# Patient Record
Sex: Female | Born: 1970 | Race: White | Hispanic: No | Marital: Married | Smoking: Never smoker
Health system: Southern US, Community
[De-identification: ages and names within clinical notes are randomized; demographics above are authoritative.]

## PROBLEM LIST (undated history)

## (undated) DIAGNOSIS — J309 Allergic rhinitis, unspecified: Secondary | ICD-10-CM

## (undated) DIAGNOSIS — E785 Hyperlipidemia, unspecified: Secondary | ICD-10-CM

## (undated) DIAGNOSIS — H1010 Acute atopic conjunctivitis, unspecified eye: Secondary | ICD-10-CM

## (undated) DIAGNOSIS — Z87442 Personal history of urinary calculi: Secondary | ICD-10-CM

## (undated) DIAGNOSIS — J302 Other seasonal allergic rhinitis: Secondary | ICD-10-CM

## (undated) DIAGNOSIS — E559 Vitamin D deficiency, unspecified: Secondary | ICD-10-CM

## (undated) DIAGNOSIS — K219 Gastro-esophageal reflux disease without esophagitis: Secondary | ICD-10-CM

## (undated) DIAGNOSIS — G4700 Insomnia, unspecified: Secondary | ICD-10-CM

## (undated) DIAGNOSIS — R748 Abnormal levels of other serum enzymes: Secondary | ICD-10-CM

## (undated) DIAGNOSIS — T4145XA Adverse effect of unspecified anesthetic, initial encounter: Secondary | ICD-10-CM

## (undated) DIAGNOSIS — F419 Anxiety disorder, unspecified: Secondary | ICD-10-CM

## (undated) DIAGNOSIS — R51 Headache: Secondary | ICD-10-CM

## (undated) DIAGNOSIS — M545 Low back pain, unspecified: Secondary | ICD-10-CM

## (undated) DIAGNOSIS — D649 Anemia, unspecified: Secondary | ICD-10-CM

## (undated) DIAGNOSIS — R519 Headache, unspecified: Secondary | ICD-10-CM

## (undated) DIAGNOSIS — R112 Nausea with vomiting, unspecified: Secondary | ICD-10-CM

## (undated) DIAGNOSIS — W3400XA Accidental discharge from unspecified firearms or gun, initial encounter: Secondary | ICD-10-CM

## (undated) DIAGNOSIS — T8859XA Other complications of anesthesia, initial encounter: Secondary | ICD-10-CM

## (undated) DIAGNOSIS — Z9889 Other specified postprocedural states: Secondary | ICD-10-CM

## (undated) HISTORY — DX: Hyperlipidemia, unspecified: E78.5

## (undated) HISTORY — DX: Low back pain, unspecified: M54.50

## (undated) HISTORY — PX: KNEE ARTHROSCOPY: SHX127

## (undated) HISTORY — DX: Low back pain: M54.5

## (undated) HISTORY — DX: Allergic rhinitis, unspecified: J30.9

## (undated) HISTORY — DX: Headache, unspecified: R51.9

## (undated) HISTORY — PX: OTHER SURGICAL HISTORY: SHX169

## (undated) HISTORY — DX: Headache: R51

## (undated) HISTORY — DX: Insomnia, unspecified: G47.00

## (undated) HISTORY — DX: Allergic rhinitis, unspecified: H10.10

## (undated) HISTORY — DX: Abnormal levels of other serum enzymes: R74.8

## (undated) HISTORY — DX: Other seasonal allergic rhinitis: J30.2

## (undated) HISTORY — PX: CHOLECYSTECTOMY: SHX55

## (undated) HISTORY — DX: Vitamin D deficiency, unspecified: E55.9

## (undated) HISTORY — DX: Anemia, unspecified: D64.9

## (undated) HISTORY — DX: Gastro-esophageal reflux disease without esophagitis: K21.9

---

## 1998-11-10 ENCOUNTER — Encounter: Payer: Self-pay | Admitting: Gynecology

## 1998-11-10 ENCOUNTER — Ambulatory Visit (HOSPITAL_COMMUNITY): Admission: RE | Admit: 1998-11-10 | Discharge: 1998-11-10 | Payer: Self-pay | Admitting: Gynecology

## 1999-08-31 ENCOUNTER — Encounter (INDEPENDENT_AMBULATORY_CARE_PROVIDER_SITE_OTHER): Payer: Self-pay

## 1999-08-31 ENCOUNTER — Observation Stay (HOSPITAL_COMMUNITY): Admission: RE | Admit: 1999-08-31 | Discharge: 1999-09-01 | Payer: Self-pay | Admitting: Gynecology

## 2000-01-16 ENCOUNTER — Other Ambulatory Visit: Admission: RE | Admit: 2000-01-16 | Discharge: 2000-01-16 | Payer: Self-pay | Admitting: Gynecology

## 2000-05-23 ENCOUNTER — Encounter: Payer: Self-pay | Admitting: Gynecology

## 2000-05-23 ENCOUNTER — Ambulatory Visit (HOSPITAL_COMMUNITY): Admission: RE | Admit: 2000-05-23 | Discharge: 2000-05-23 | Payer: Self-pay | Admitting: Gynecology

## 2001-05-15 ENCOUNTER — Encounter: Payer: Self-pay | Admitting: Gynecology

## 2001-05-15 ENCOUNTER — Ambulatory Visit (HOSPITAL_COMMUNITY): Admission: RE | Admit: 2001-05-15 | Discharge: 2001-05-15 | Payer: Self-pay | Admitting: Gynecology

## 2002-01-21 ENCOUNTER — Other Ambulatory Visit: Admission: RE | Admit: 2002-01-21 | Discharge: 2002-01-21 | Payer: Self-pay | Admitting: Gynecology

## 2002-08-24 ENCOUNTER — Encounter (INDEPENDENT_AMBULATORY_CARE_PROVIDER_SITE_OTHER): Payer: Self-pay | Admitting: Specialist

## 2002-08-24 ENCOUNTER — Ambulatory Visit (HOSPITAL_BASED_OUTPATIENT_CLINIC_OR_DEPARTMENT_OTHER): Admission: RE | Admit: 2002-08-24 | Discharge: 2002-08-24 | Payer: Self-pay | Admitting: Gynecology

## 2003-03-04 ENCOUNTER — Emergency Department (HOSPITAL_COMMUNITY): Admission: EM | Admit: 2003-03-04 | Discharge: 2003-03-04 | Payer: Self-pay | Admitting: Emergency Medicine

## 2003-08-11 ENCOUNTER — Ambulatory Visit (HOSPITAL_COMMUNITY): Admission: RE | Admit: 2003-08-11 | Discharge: 2003-08-11 | Payer: Self-pay | Admitting: Family Medicine

## 2003-10-08 ENCOUNTER — Encounter (INDEPENDENT_AMBULATORY_CARE_PROVIDER_SITE_OTHER): Payer: Self-pay | Admitting: *Deleted

## 2003-10-08 ENCOUNTER — Inpatient Hospital Stay (HOSPITAL_COMMUNITY): Admission: AD | Admit: 2003-10-08 | Discharge: 2003-10-08 | Payer: Self-pay | Admitting: Gynecology

## 2004-03-13 ENCOUNTER — Other Ambulatory Visit: Admission: RE | Admit: 2004-03-13 | Discharge: 2004-03-13 | Payer: Self-pay | Admitting: Gynecology

## 2006-01-07 ENCOUNTER — Inpatient Hospital Stay (HOSPITAL_COMMUNITY): Admission: AD | Admit: 2006-01-07 | Discharge: 2006-01-07 | Payer: Self-pay | Admitting: Gynecology

## 2006-01-14 ENCOUNTER — Inpatient Hospital Stay (HOSPITAL_COMMUNITY): Admission: AD | Admit: 2006-01-14 | Discharge: 2006-01-14 | Payer: Self-pay | Admitting: Gynecology

## 2006-02-28 ENCOUNTER — Ambulatory Visit (HOSPITAL_COMMUNITY): Admission: RE | Admit: 2006-02-28 | Discharge: 2006-02-28 | Payer: Self-pay | Admitting: Specialist

## 2006-08-11 ENCOUNTER — Ambulatory Visit (HOSPITAL_COMMUNITY): Admission: RE | Admit: 2006-08-11 | Discharge: 2006-08-12 | Payer: Self-pay | Admitting: Neurosurgery

## 2007-01-12 ENCOUNTER — Ambulatory Visit (HOSPITAL_COMMUNITY): Admission: RE | Admit: 2007-01-12 | Discharge: 2007-01-12 | Payer: Self-pay | Admitting: Neurosurgery

## 2007-04-03 ENCOUNTER — Observation Stay (HOSPITAL_COMMUNITY): Admission: RE | Admit: 2007-04-03 | Discharge: 2007-04-03 | Payer: Self-pay | Admitting: Neurosurgery

## 2007-08-06 ENCOUNTER — Inpatient Hospital Stay (HOSPITAL_COMMUNITY): Admission: EM | Admit: 2007-08-06 | Discharge: 2007-08-11 | Payer: Self-pay | Admitting: Emergency Medicine

## 2007-08-06 ENCOUNTER — Encounter: Payer: Self-pay | Admitting: Emergency Medicine

## 2007-11-05 ENCOUNTER — Other Ambulatory Visit: Admission: RE | Admit: 2007-11-05 | Discharge: 2007-11-05 | Payer: Self-pay | Admitting: Gynecology

## 2008-02-22 ENCOUNTER — Ambulatory Visit (HOSPITAL_BASED_OUTPATIENT_CLINIC_OR_DEPARTMENT_OTHER): Admission: RE | Admit: 2008-02-22 | Discharge: 2008-02-22 | Payer: Self-pay | Admitting: Family Medicine

## 2008-02-28 ENCOUNTER — Ambulatory Visit: Payer: Self-pay | Admitting: Internal Medicine

## 2008-05-24 ENCOUNTER — Inpatient Hospital Stay (HOSPITAL_COMMUNITY): Admission: AD | Admit: 2008-05-24 | Discharge: 2008-05-24 | Payer: Self-pay | Admitting: Obstetrics and Gynecology

## 2008-06-15 ENCOUNTER — Ambulatory Visit (HOSPITAL_COMMUNITY): Admission: RE | Admit: 2008-06-15 | Discharge: 2008-06-15 | Payer: Self-pay | Admitting: Obstetrics and Gynecology

## 2008-08-26 ENCOUNTER — Inpatient Hospital Stay (HOSPITAL_COMMUNITY): Admission: AD | Admit: 2008-08-26 | Discharge: 2008-08-26 | Payer: Self-pay | Admitting: Obstetrics and Gynecology

## 2008-09-04 ENCOUNTER — Inpatient Hospital Stay (HOSPITAL_COMMUNITY): Admission: AD | Admit: 2008-09-04 | Discharge: 2008-09-04 | Payer: Self-pay | Admitting: Obstetrics and Gynecology

## 2008-09-14 ENCOUNTER — Inpatient Hospital Stay (HOSPITAL_COMMUNITY): Admission: AD | Admit: 2008-09-14 | Discharge: 2008-09-14 | Payer: Self-pay | Admitting: Obstetrics & Gynecology

## 2008-11-10 ENCOUNTER — Inpatient Hospital Stay (HOSPITAL_COMMUNITY): Admission: AD | Admit: 2008-11-10 | Discharge: 2008-11-10 | Payer: Self-pay | Admitting: Obstetrics and Gynecology

## 2008-11-12 ENCOUNTER — Inpatient Hospital Stay (HOSPITAL_COMMUNITY): Admission: AD | Admit: 2008-11-12 | Discharge: 2008-11-12 | Payer: Self-pay | Admitting: Obstetrics and Gynecology

## 2008-11-17 ENCOUNTER — Inpatient Hospital Stay (HOSPITAL_COMMUNITY): Admission: RE | Admit: 2008-11-17 | Discharge: 2008-11-19 | Payer: Self-pay | Admitting: Obstetrics and Gynecology

## 2009-11-30 ENCOUNTER — Inpatient Hospital Stay (HOSPITAL_COMMUNITY): Admission: RE | Admit: 2009-11-30 | Discharge: 2009-12-01 | Payer: Self-pay | Admitting: Obstetrics and Gynecology

## 2009-11-30 ENCOUNTER — Encounter (INDEPENDENT_AMBULATORY_CARE_PROVIDER_SITE_OTHER): Payer: Self-pay | Admitting: Obstetrics and Gynecology

## 2010-02-18 HISTORY — PX: ABDOMINAL HYSTERECTOMY: SUR658

## 2010-02-18 HISTORY — PX: OOPHORECTOMY: SHX86

## 2010-05-02 LAB — CBC
Hemoglobin: 12.1 g/dL (ref 12.0–15.0)
Hemoglobin: 9.1 g/dL — ABNORMAL LOW (ref 12.0–15.0)
MCH: 26.1 pg (ref 26.0–34.0)
MCHC: 33.2 g/dL (ref 30.0–36.0)
MCV: 78.8 fL (ref 78.0–100.0)
MCV: 78.8 fL (ref 78.0–100.0)
Platelets: 293 10*3/uL (ref 150–400)
Platelets: 298 10*3/uL (ref 150–400)
RBC: 3.5 MIL/uL — ABNORMAL LOW (ref 3.87–5.11)
RDW: 15.4 % (ref 11.5–15.5)
RDW: 15.8 % — ABNORMAL HIGH (ref 11.5–15.5)
WBC: 12.1 10*3/uL — ABNORMAL HIGH (ref 4.0–10.5)

## 2010-05-02 LAB — PREGNANCY, URINE: Preg Test, Ur: NEGATIVE

## 2010-05-24 LAB — CBC
HCT: 23.6 % — ABNORMAL LOW (ref 36.0–46.0)
MCV: 81.1 fL (ref 78.0–100.0)
Platelets: 251 10*3/uL (ref 150–400)

## 2010-05-25 LAB — CBC
MCHC: 32.4 g/dL (ref 30.0–36.0)
Platelets: 317 10*3/uL (ref 150–400)
WBC: 10 10*3/uL (ref 4.0–10.5)

## 2010-05-25 LAB — TYPE AND SCREEN: ABO/RH(D): A POS

## 2010-05-25 LAB — RPR: RPR Ser Ql: NONREACTIVE

## 2010-05-27 LAB — URINALYSIS, ROUTINE W REFLEX MICROSCOPIC
Bilirubin Urine: NEGATIVE
Glucose, UA: NEGATIVE mg/dL
Nitrite: NEGATIVE

## 2010-05-30 LAB — CBC
Hemoglobin: 11.4 g/dL — ABNORMAL LOW (ref 12.0–15.0)
Hemoglobin: 11.9 g/dL — ABNORMAL LOW (ref 12.0–15.0)
MCHC: 34.1 g/dL (ref 30.0–36.0)
MCV: 83.1 fL (ref 78.0–100.0)
MCV: 85.1 fL (ref 78.0–100.0)
Platelets: 314 10*3/uL (ref 150–400)
RBC: 3.98 MIL/uL (ref 3.87–5.11)
RDW: 13.8 % (ref 11.5–15.5)
RDW: 13.9 % (ref 11.5–15.5)
WBC: 10.1 10*3/uL (ref 4.0–10.5)

## 2010-07-03 NOTE — Op Note (Signed)
[name], [name]          ACCOUNT NO.:  [id number]   MEDICAL RECORD NO.:  [id number]          PATIENT TYPE:  AMB   LOCATION:  SDC                           FACILITY:  [facility]   PHYSICIAN:  Duke Salvia. Marcelle Overlie, M.D.DATE OF BIRTH:  [birth year]   DATE OF PROCEDURE:  06/15/2008  DATE OF DISCHARGE:                               OPERATIVE REPORT   PREOPERATIVE DIAGNOSES:  1. History of possible incompetent cervix.  2. 16-1/2-week intrauterine pregnancy.   POSTOPERATIVE DIAGNOSES:  1. History of possible incompetent cervix.  2. 16-1/2-week intrauterine pregnancy.   PROCEDURE:  McDonald cerclage.   SURGEON:  Duke Salvia. Marcelle Overlie, MD   ANESTHESIA:  Spinal.   COMPLICATIONS:  None.   DRAINS:  In-and-out Foley catheter.   BLOOD LOSS:  Minimal.   PROCEDURE AND FINDINGS:  The patient went to the operating room after an  adequate level of spinal anesthetic was obtained.  The patient's leg in  stirrups, the perineum and vagina prepped in usual manner for vaginal  procedures.  The bladder was drained.  A weighted speculum was  positioned.  A #2 Tevdek suture was used to place a pursestring McDonald  cerclage starting at 11 o'clock at the cervicovaginal junction.  Ring  forceps used to gently place the cervix on traction as needed to place  circumferential pursestring suture tied down anteriorly.  There was  minimal bleeding.  She tolerated this well and went to recovery room in  good condition.      Richard M. Marcelle Overlie, M.D.  Electronically Signed     RMH/MEDQ  D:  06/15/2008  T:  06/16/2008  Job:  [id number]

## 2010-07-03 NOTE — Op Note (Signed)
[name], [name]          ACCOUNT NO.:  [id number]   MEDICAL RECORD NO.:  [id number]          PATIENT TYPE:  AMB   LOCATION:  SDS                          FACILITY:  [facility]   PHYSICIAN:  Donalee Citrin, M.D.        DATE OF BIRTH:  [birth year]   DATE OF PROCEDURE:  01/12/2007  DATE OF DISCHARGE:  01/12/2007                               OPERATIVE REPORT   PREOPERATIVE DIAGNOSIS:  Left carpal tunnel syndrome.   PROCEDURE:  Left carpal tunnel release.   SURGEON:  Donalee Citrin, M.D.   ANESTHESIA:  Bier block.   HISTORY OF PRESENT ILLNESS:  The patient is very pleasant 40 year old  female who has a longstanding numbness, tingling and pain in her wrist.  EMG showed moderately severe left carpal tunnel syndrome.  The patient  was refractory to all forms of conservative treatment, bracing and  injections and the patient presents for carpal tunnel release.  The  risks and benefits of the operation were explained to the patient and  she agrees to proceed forward.   The patient was brought into the OR and was induced under Bier block  anesthesia with a regional block of the left upper extremity.  Then  after adequate anesthesia had been confirmed, an incision was made out  from the distal crease of the wrist along the palmar crease towards the  second digit, excluding the thumb, approximately 3-4 cm long.  This was  incised with a 15 blade scalpel, a mastoid retractor was placed, and  then using a 15-blade scalpel with careful sharp dissection layer by  layer through the transverse carpal ligament and flexor retinaculum,  this ligament was divided until the epineurium of the nerve was then  visualized.  Then using a hemostat to develop the plane from the  undersurface of the ligament overlying the epineurium and hemostats the  holding it up and away, the ligament was then divided proximally and  distally until complete decompression of the median nerve was obtained.  After this was  observed and the hemostat easily passed both proximally  and distally, the wound copiously irrigated and the skin was closed with  interrupted vertical mattress, dressed with Adaptic, fluffs and Kerlix,  and the patient went to the recovery room in stable condition.  At the  end of the case all needle counts and sponge counts were correct.           ______________________________  Donalee Citrin, M.D.     GC/MEDQ  D:  01/12/2007  T:  01/13/2007  Job:  [id number]

## 2010-07-03 NOTE — Op Note (Signed)
[name], [name]          ACCOUNT NO.:  [id number]   MEDICAL RECORD NO.:  [id number]          PATIENT TYPE:  OIB   LOCATION:  3033                         FACILITY:  [facility]   PHYSICIAN:  Donalee Citrin, M.D.        DATE OF BIRTH:  [birth year]   DATE OF PROCEDURE:  08/11/2006  DATE OF DISCHARGE:  08/12/2006                               OPERATIVE REPORT   PREOPERATIVE DIAGNOSIS:  Cervical spondylosis with radiculopathy C6,  left.   PROCEDURE:  Anterior cervical diskectomy and fusion at C5-6 using a 6-mm  allograft wedge at C5-6, a 23-mm Venture plate with four 35 mm screws.   SURGEON:  Donalee Citrin, M.D.   ASSISTANTJordan Likes.   ANESTHESIA:  General endotracheal.   HISTORY OF PRESENT ILLNESS:  A 40 year old female who has had  longstanding neck and left arm pain radiating down her forearm and thumb  with numbness and tingling in the same distribution.  The patient  appeared to have  weakness of her biceps and little bit in her triceps.  __________  shows spondylosis with spondylolytic compression and disk at  the left C6 nerve root.  The patient failed of form of conservative  treatment.  Risks and benefits of the operation were explained to the  patient and she agreed to proceed forward.   DESCRIPTION OF PROCEDURE:  The patient was brought to the OR, was  induced under general anesthesia, placed supine with neck in extension  in five pounds of haltertraction and the right side of her neck was  prepped and draped in the usual sterile fashion.  __________  localized  the C5-6 disk space.  A curvilinear incision was made __________  sternocleidomastoid superficially, and the platysma was dissected out  and divided longitudinally.  The avascular plane between the  sternocleidomastoid and the strap muscles was developed down to the  prevertebral fascia.  The prevertebral fascia __________ .  Intraoperative x-ray confirmed localization of the appropriate level,  annulotomy with a 15  blade scalpel marked the disk space and then the  __________  retracted laterally and a self-retaining retractor was  placed.  Annulotomy was then extended, disk space was cleaned out,  drilled down the posterior annulus and posterior __________  complex  with a high speed drill.  Then using a 1-mm Kerrison punch the  undersurface of the C5 and C6 vertebral bodies were underbitten exposing  the thecal sac and this was carried out to the C6 neural foramen  __________  several __________  were removed from the foramen with a 2-  mm Kerrison punch and a black nerve hook decompressing the nerve root  flush with the C6 pedicle.  __________  there was some spondylosis with  a spur compressing the right side of the central canal and thecal sac.  This was all underbitten with a 2-mm Kerrison punch decompressing the  right C6 nerve root flush to the pedicles as well.  After both foramina  were widely patent and the central canal was decompressed and was  copiously irrigated a size 6-mm graft was inserted after preparation of  the endplates.  A 23-mm Venture plate was then inserted with four 32-mm  screws were drilled and placed, all screws had excellent purchase,  locking mechanism was engaged.  The meticulous hemostasis  was maintained.  The wound was copiously irrigated.  The wound was  closed in layers with interrupted Vicryl in the platysma and a running 4-  0 subcuticular.  Benzoin and Steri-Strips applied.  The patient went to  the recovery room in stable condition.           ______________________________  Donalee Citrin, M.D.     GC/MEDQ  D:  08/11/2006  T:  08/12/2006  Job:  [id number]
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
## 2010-07-03 NOTE — Op Note (Signed)
[name], [name]          ACCOUNT NO.:  [id number]   MEDICAL RECORD NO.:  [id number]          PATIENT TYPE:  OBV   LOCATION:  3526                         FACILITY:  [facility]   PHYSICIAN:  Donalee Citrin, M.D.        DATE OF BIRTH:  [birth year]   DATE OF PROCEDURE:  04/03/2007  DATE OF DISCHARGE:  04/03/2007                               OPERATIVE REPORT   PREOPERATIVE DIAGNOSIS:  Right carpal tunnel syndrome.   PROCEDURE:  Right carpal tunnel release.   SURGEON:  Donalee Citrin, M.D.   ANESTHESIA:  Bier block.   HISTORY OF PRESENT ILLNESS:  The patient is a very pleasant 36-year  female who underwent left carpal tunnel release several months ago who  has progressively worsening right hand pain, EMG consistent with right  carpal tunnel syndrome.  The patient failed all forms of conservative  treatment with bracing.  The patient was recommended a right carpal  release, the risks and benefits of the operation were discussed with the  patient, and she agreed to proceed forward.   The patient was brought to the OR and was induced under Bier block  anesthesia with tourniquet time being proximally 30 minutes.  The right  hand was prepped and draped in the usual sterile fashion.  An incision  was drawn up from the distal crease at the wrist, along the palmar  crease toward the middle finger, extending approximately 4 cm long.  A  15 blade scalpel was used to make the incision after adequate anesthesia  and analgesia was confirmed.  The palmaris fascia was dissected to the  transverse carpal ligament and the flexor retinaculum.  This was then  divided sequentially in layers very carefully with a 15 blade scalpel  until the epineurium of the nerve was visualized.  Then using a  hemostat, mosquito, the undersurface of the transverse carpal ligament  was freed up from the underlying median nerve epineurium and this was  divided, decompressing the median nerve both proximally and distally.  Extensive undercutting of the transverse carpal ligament along the track  of the median nerve was carried out very carefully until easily the  sheath around the median nerve easily passed a hemostat both proximally  and distally up the medial aspect of the forearm as well as distal part  of the hand.  The wound was then copiously irrigated and meticulous  hemostasis was maintained and the skin was closed in layers with  interrupted vertical mattress.  Tourniquet time:  It was then released  again in proximally 30 minutes and the patient was moved to the recovery  room in stable condition.           ______________________________  Donalee Citrin, M.D.     GC/MEDQ  D:  04/03/2007  T:  04/04/2007  Job:  [id number]

## 2010-07-03 NOTE — Procedures (Signed)
NAME:  [name], [name]          ACCOUNT NO.:  [id number]   MEDICAL RECORD NO.:  [id number]          PATIENT TYPE:  OUT   LOCATION:  SLEEP CENTER                 [facility]ACILITY:  [facility]   PHYSICIAN:  Clinton D. Maple Hudson, MD, FCCP, FACPDATE OF BIRTH:  1970-04-22   DATE OF STUDY:  02/22/2008                            NOCTURNAL POLYSOMNOGRAM   REFERRING PHYSICIAN:  Renae Fickle   REFERRING PHYSICIAN:  Dr. Renae Fickle   INDICATION FOR STUDY:  Hypersomnia with sleep apnea.   EPWORTH SLEEPINESS SCORE:  Epworth sleepiness score 12/24.  BMI 27.8.  Weight 157 pounds.  Height 63 inches.  Neck 14 inches.   MEDICATIONS:  Home medications are charted and reviewed.   SLEEP ARCHITECTURE:  Total sleep time 339 minutes with sleep efficiency  86.8%.  Stage I was 3.4%.  Stage II 76%.  Stage III absent.  REM 20.6%  of total sleep time.  Sleep latency 13 minutes.  REM latency 105  minutes.  Awake after sleep onset 37 minutes.  Arousal index 16.8.  No  bedtime medication was taken.   RESPIRATORY DATA:  Apnea-hypopnea index (AHI) 3.4 per hour.  A total of  19 events were scored including 1 obstructive apnea, 2 central apneas,  and 16 hypopneas.  Events were mostly associated with supine sleep,  although she appeared to sleep mostly on her sides.  There were  insufficient events to quantify for CPAP titration by split protocol on  the study night.   OXYGEN DATA:  Moderately loud snoring with oxygen desaturation to a  nadir of 90%.  Mean oxygen saturation through the study was 95.3% on  room air.   CARDIAC DATA:  Normal sinus rhythm.   MOVEMENT-PARASOMNIA:  Periodic limb movement with arousal.  A total of  58 limb jerks were counted of which 19 were associated with arousal or  awakening for an arousal index of 3.4 per hour.  Bathroom x1.   IMPRESSIONS-RECOMMENDATIONS:  1. Occasional respiratory events with sleep disturbance, within      normal, apnea-hypopnea index 3.4 per hour (normal range 0-5 per  hour).  Events were associated with nonsupine sleep position,      moderately loud snoring and oxygen desaturation to a nadir of 90%.  2. Periodic limb jerk syndrome with arousal 3.4 per hour.  This may      reflect enough movement disturbance in the      home environment to interfere with sleep quality.  Consider a      therapeutic trial of ReQuip or Mirapex if clinically indicated.      Clinton D. Maple Hudson, MD, [facility], FACP  Diplomate, Biomedical engineer of Sleep Medicine  Electronically Signed     CDY/MEDQ  D:  02/27/2008 09:38:42  T:  02/27/2008 16:10:96  Job:  [id number]
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
## 2010-07-03 NOTE — H&P (Signed)
NAMEHAZELYN, [name]          ACCOUNT NO.:  [id number]   MEDICAL RECORD NO.:  [id number]          PATIENT TYPE:  AMB   LOCATION:  SDC                           FACILITY:  [facility]   PHYSICIAN:  Duke Salvia. Marcelle Overlie, M.D.DATE OF BIRTH:  [birth year]   DATE OF ADMISSION:  DATE OF DISCHARGE:                              HISTORY & PHYSICAL   DATE OF SCHEDULED SURGERY:  June 15, 2008.   CHIEF COMPLAINT:  Pregnancy, incompetent cervix.   HISTORY OF PRESENT ILLNESS:  A 40 year old G7, P2-0-4-2.  This is an IVF  pregnancy.  When she was being evaluated and treated in 2008, had an  open myomectomy and as part of her evaluation was told that she had an  incompetent cervix based on a history of SAB x4.  Two prior pregnancies  in 1989 and 1992 were delivered at 37 weeks vaginally.   She has had a prior tubal with reversal in 2001.  This pregnancy has  been complicated by a subchorionic hemorrhage with gradual resolution.   EXAM:  By ultrasound the day prior to surgery, revealed a cervical  length of 4.5 cm with a persistent subchorionic hemorrhage.  No evidence  of funneling.  She has declined amniocentesis with a first trimester  screen and AFP only, both of which have returned normal.  This  procedure, including risks related to bleeding, infection, are reviewed  with her.  Her history for incompetent cervix are somewhat unclear,  based more on recommendations for her treating physician in [place].  This  was reviewed with her.  She would prefer to proceed with McDonald  cerclage.   PAST MEDICAL HISTORY:   ALLERGIES:  None.   SURGICAL HISTORY:  Please see her [facility] form.   REVIEW OF SYSTEMS:  Please see her Hollister form.   PHYSICAL EXAM:  VITAL SIGNS:  Temperature 98.2, blood pressure 120/72.  HEENT:  Unremarkable.  NECK:  Supple without masses.  LUNGS:  Clear.  CARDIOVASCULAR:  Regular rate and rhythm without murmurs, rubs, or  gallops noted.  BREASTS:  Without masses.  ABDOMEN:  A 16-week-sized uterus.  Fetal heart rate 140.  GENITOURINARY:  Cervix was closed.  EXTREMITIES:  Unremarkable.  NEUROLOGIC:  Unremarkable.   IMPRESSION:  1. A 16-1/2 week intrauterine pregnancy.  2. History of prior myomectomy.  3. Advanced maternal age.  4. In vitro fertilization pregnancy.  5. History of possible incompetent cervix.   PLAN:  McDonald cerclage procedure and risks reviewed as above.      Richard M. Marcelle Overlie, M.D.  Electronically Signed     RMH/MEDQ  D:  06/14/2008  T:  06/14/2008  Job:  [id number]

## 2010-07-06 NOTE — H&P (Signed)
[facility]  Patient:    [name], [name]                    MRN: [id number] Adm. Date:  08/31/99 Attending:  Marcial Pacas P. Fontaine, M.D.                         History and Physical  CHIEF COMPLAINT:              Secondary infertility.  HISTORY OF PRESENT ILLNESS:   A 40 year old, gravida 2, para 2, female status post laparoscopic tubal sterilization who desires bilateral tubal reanastomosis. The patient has undergone laparoscopy which has revealed bilateral 5 cm normal appearing distal segments and proximal segments of 1.5 cm on the left and 0.5 cm on the right.  She does have a history of endometriosis, status post laser vaporization in the past.  She recently underwent HSG which revealed a normal endometrial cavity and bilateral tubal proximal lengths of 3 cm.  The patient is admitted at this time for bilateral tubal reanastomoses.  PAST MEDICAL HISTORY:         Uncomplicated.  PAST SURGICAL HISTORY:        Includes laparoscopic tubal sterilization and diagnostic laparoscopy x 2 in 1994 and in 1995.  ALLERGIES:                    No known drug allergies.  SOCIAL HISTORY:               Noncontributory without cigarette or alcohol use.   FAMILY HISTORY:               Noncontributory.  REVIEW OF SYSTEMS:            Noncontributory.  PHYSICAL EXAMINATION:  VITAL SIGNS:                  Afebrile, vital signs are stable.  HEENT:                        Normal.  LUNGS:                        Clear.  HEART:                        Regular rate and rhythm without murmurs, rubs, or  gallops.  ABDOMEN:                      Benign.  PELVIC:                       External BUS and vagina normal.  Cervix normal. Uterus normal size and nontender.  Adnexa without masses or tenderness.  ASSESSMENT:                   A 40 year old, gravida 2, para 2, female status post laparoscopic tubal sterilization who desires bilateral tubal reanastomoses  due o a new relationship.  The patient has undergone evaluation to include laparoscopy which showed normal distal segments of 5 cm and proximal segments of 1.5 cm on ne side and 0.5 cm on the other.  HSG shows 3 cm proximal segments bilaterally. The risks, benefits, indications, and alternatives for bilateral tubal reanastomosis were discussed with the patient and the expected intraoperative and postoperative courses were reviewed.  I discussed  with the patient the procedure on several occasions on an outpatient basis and offered referral to a reproductive endocrinologist not only to review the procedure, but to discuss in vitro fertilization.  The patient understands clearly the alternative for in vitro fertilization and the advantages, risks, benefits of in vitro fertilization versus tubal reanastomosis and she wants to proceed with tubal reanastomosis.  No guarantees as far as the success for the procedure were made.  The patient understands that at any time during the procedure we may abort the procedure due to unexpected findings intraoperatively and we may not be able to proceed with either side or that we may able to do one side and not the other and she understands this possibility and accepts this.  The patient also understands that if we are successful in reanastomosing her fallopian tubes no guarantees as far as pregnancy were made.  There are many factors that influence pregnancies and she understands due to her history of endometriosis that certainly this is an independent risk factor for infertility.  I have also asked in the past for her partner to provide a semenanalysis and she rejects this request.  He has fathered other children, but she also understands that she may go through the procedure and then we ultimately find out that he is infertile and she understands and accepts this.  She understands also that the quality of the fallopian tubes as well as the  final length will also influence her outcome.  The patient understands that if we are  successful, she is at an increased risk for an ectopic pregnancy and that she would report any pregnancy event immediately so that we can locate and determine the viability of her gestation.  The patient does have regular menses with minimal symptoms and has no problems achieving pregnancy with her first two gestations. I discussed my recommendations that after the initial healing process that her and her partner attempt pregnancy quickly to maximize her chances.  The general risks of abdominal surgery were reviewed with her.  The patient was counseled as to the risks for inadvertant injury to internal organs including bowel, bladder, ureters, vessels, and nerves requiring more extensive reparative surgeries and future reparative surgeries including ostomy formation was discussed with her.  The risks of hemorrhage leading to transfusion and the risks of transfusion including transfusion reaction, hepatitis, and HIV were all discussed, understood, and accepted.  The risks for wound complications including opening and draining of incisions, closure by secondary intention, as well as internal peritoneal infections requiring prolonged antibiotics was all discussed, understood, and accepted.  The patients questions were answered to her satisfaction and she is ready to proceed with surgery. DD:  08/30/99 TD:  08/30/99 Job: [id number]
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
## 2010-07-06 NOTE — Op Note (Signed)
[facility]  Patient:    [name], [name]                  MRN: [id number] Proc. Date: 08/31/99 Adm. Date:  [id number] Disc. Date: [id number] Attending:  Merrily Pew                           Operative Report  ADDENDUM TO OPERATIVE PROCEDURE NOTE:  Pathology report subsequently returns postoperative and showed left ovarian cyst fluid which showed findings consistent with the contents of a benign cyst.  Intraoperatively, the patient had a functional small left ovarian cyst that normally would not have been addressed because of its normal appearance, but during the reconstruction of the left fallopian tube to easily approximate the distal and proximal segments, the cyst fluid was aspirated to collapse the cyst to allow the segments to reapproximate without tension.  The intention was not to send this to pathology as it was totally benign in appearance, again, consistent with a functional cyst, and apparently, the scrub technician inadvertently sent this.  The functional cyst was approximately 1.5 cm in diameter and was collapsed with aspiration and was otherwise left undisturbed with no bleeding. DD:  09/07/99 TD:  09/09/99 Job: [id number]

## 2010-07-06 NOTE — H&P (Signed)
NAME:  [name], [name]                     ACCOUNT NO.:  [id number]   MEDICAL RECORD NO.:  [id number]                   PATIENT TYPE:  AMB   LOCATION:  [facility]                                 FACILITY:  [facility]   PHYSICIAN:  Timothy P. Fontaine, M.D.           DATE OF BIRTH:  25-Feb-1970   DATE OF ADMISSION:  DATE OF DISCHARGE:                                HISTORY & PHYSICAL   CHIEF COMPLAINT:  1. Endometriosis.  2. Infertility.   HISTORY OF PRESENT ILLNESS:  [age]-year-old G2, P2 female, history of  endometriosis in the past, status post laparoscopic diagnoses and laser  vaporization with secondary infertility.  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  She has  a normal appearing right fallopian tube with fill and spill and a normal  appearing left fallopian tube with localized spill at the tip consistent  with peritubular adhesions.  She underwent consultation with [facility] for Reproductive Medicine where they discussed the options to include  in vitro fertilization as well as laparoscopic evaluation given her history  of endometriosis and she elects for laparoscopic surgery.   PAST MEDICAL HISTORY:  Uncomplicated.   PAST SURGICAL HISTORY:  Laparoscopic tubal sterilization.  Laparoscopic  laser vaporization and endometriosis.  Bilateral tubal re-anastomoses.   ALLERGIES:  No medications.   CURRENT MEDICATIONS:  Protonix for reflux.   REVIEW OF SYSTEMS:  Noncontributory.   SOCIAL HISTORY:  Noncontributory.   FAMILY HISTORY:  Noncontributory.   ADMISSION PHYSICAL EXAM:  Afebrile.  Vital signs are stable.  HEENT:  Normal.  LUNGS:  Clear.  CARDIAC:  Regular rate without rubs, murmurs, or gallops.  ABDOMEN:  Benign.  PELVIC:  External BUS.  Vagina normal.  Cervix normal.  Uterus normal size,  nontender.  Adnexa  without masses or tenderness.   ASSESSMENT:  1. [age]-year-old G2, P2 female with secondary infertility, status     post tubal sterilization and tubal reversal, gonadotropin superovatory     cycle without success.  2. History of laparoscopic proven endometriosis for laparoscopy, possible     lysis of adhesion, possible ablation of endometriosis.  The risks,     benefits, indications, and alternatives for the procedure were reviewed     with the patient to include the expected intraoperative and postoperative     courses.  Instrumentation, insufflation, trocar placement, use of sharp     blunted dissection, and electrocautery as well as laser were all     discussed, understood, and accepted.  The risks of dissection, both     internal, requiring prolonged antibiotics as well as incision requiring     opening and draining of incisions was discussed, understood, and     accepted.  The risks of bleeding leading to hemorrhage, necessitating     transfusion, and the risks of transfusion were reviewed to include  transfusion reaction, hepatitis, HIV, mad cow disease, and other unknown     diseases.  The risks of inadvertent injury to internal organs including     bowel, bladder, ureters, vessels, and nerves, necessitating major     exploratory reparative surgeries and future reparative surgery including     ostomy formation was all discussed, understood, and accepted.  The     patient understands the possibilities as far as disease to include     adhesions as well as endometriosis and she understands that we will make     all attempts to eradicate disease as encountered as I feel safe to do at     that time.  If it is unsafe in my judgment to proceed with treatment,     then we will terminate the procedure at that time and further discuss     following the procedure.  The patient clearly understands no guarantees     as far as outcome were made and she understands that she may continue  to     have difficulties achieving pregnancy and may never achieve pregnancy     regardless of this surgery and she understands and accepts this     possibility.  She has also been counseled by Dr. Assunta Curtis at the [facility] for reproductive medicine and she understands     alternatives to include in vitro and she elects for laparoscopy and     declines other alternatives at this time.  The patient's questions were     answered to her satisfaction and she is ready to proceed with surgery.                                               Timothy P. Audie Box, M.D.    TPF/MEDQ  D:  08/23/2002  T:  08/23/2002  Job:  [id number]
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
## 2010-07-06 NOTE — Op Note (Signed)
[facility]  Patient:    [name], [name]                  MRN: [id number] Proc. Date: 08/31/99 Adm. Date:  [id number] Attending:  Merrily Pew                           Operative Report  PREOPERATIVE DIAGNOSIS:       Secondary infertility.  POSTOPERATIVE DIAGNOSIS:      Secondary infertility.  OPERATION:                    Microscopic bilateral tubal reanastomoses.  SURGEON:                      Timothy P. Fontaine, M.D.  ASSISTANT:                    Rande Brunt. Eda Paschal, M.D.  ANESTHESIA:                   General endotracheal.  COMPLICATIONS:                None.  ESTIMATED BLOOD LOSS:         Less than 100 cc.  SPECIMEN:                     Peritubular tissue.  FINDINGS:                     Uterus normal.  Right and left ovaries grossly normal, free and mobile.  Approximately 6 cm distal fallopian tubes bilateral, normal fimbriae bilaterally.  A 1.5 cm proximal segment on the left, a 0.5 cm proximal segment on the right.  Both sides with positive fill and spill at the end of the procedure without leak.  Total tubal length approximately 6 cm bilaterally post reanastomosis.  No evidence of adhesions or endometriosis.  DESCRIPTION OF PROCEDURE:     The patient was taken to the operating room, underwent general endotracheal anesthesia, received an abdominal preparation with Betadine scrub and Betadine solution.  Received a perineal and vaginal scrub with Betadine scrub and Betadine solution.  A pediatric Foley was placed within the endometrial cavity and inflated for dye instillation.  A Foley catheter was placed with sterile technique, and the patient was subsequently draped in the usual fashion.  The abdomen was sharply entered through a Pfannenstiel incision, achieving adequate hemostasis at all levels.  The Balfour retractor and bladder blade were placed within the incision, and the intestines were packed from the operative  field using covered lap sponges. The uterus was elevated from the pelvis and with injection of intrauterine dye, both proximal segments were sharply incised until lumen was entered, with free spill of dye.  A pediatric feeding tube was positioned to both right and left fimbriated ends of the fallopian tubes with instillation of dye.  In a similar process, the distal segments were sharply incised until tubal lumen was encountered.  The distal and proximal segments were then loosely reapproximated bilaterally using 6-0 Vicryl interrupted suture along the mesosalpinx to bring the segments in close proximity and to reduce tension for ultimate reanastomosis.  The operating microscope was then brought into the field and after microscopic guidance, the left fallopian tube was reapproximated, beginning with interrupted 8-0 Vicryl interrupted sutures from muscularis to muscularis in a 12, 3, 6, and 9  oclock position, reapproximating the tubal lumens.  Subsequently the serosa was reapproximated using 7-0 Vicryl in several interrupted sutures, reducing the tension on the muscularis stitches.  Attention was then turned to the right side and in similar fashion, the tubal lumens were reapproximated using 8-0 Vicryl in interrupted stitch from muscularis to muscularis in a 12, 3, 6, and 9 oclock fashion.  The serosa was then reapproximated using 7-0 Vicryl in a running stitch.  At this point, dye was instilled into the inner cavity and there was positive fill and spill of both fallopian tubes with no leaks at either of the anastomotic sites.  The pelvis was then copiously irrigated with removal of all visual blood clots, the bowel packing removed, the pelvis again irrigated, leaving the pelvis moist, and subsequently the anterior fascia was reapproximated using 0 Vicryl in a running stitch, starting at the angle and meeting in the middle.  Subcutaneous tissues were irrigated with adequate hemostasis  achieved with electrocautery, and the skin was reapproximated with staples.  A sterile dressing was applied.  The patient was awakened without difficulty and taken to the recovery room in good condition, having tolerated the procedure well. DD:  08/31/99 TD:  08/31/99 Job: [id number]

## 2010-07-06 NOTE — Discharge Summary (Signed)
[facility]  Patient:    [name], [name]                  MRN: [id number] Adm. Date:  [id number] Disc. Date: [id number] Attending:  Merrily Pew Dictator:   Antony Contras, [facility]                           Discharge Summary  DISCHARGE DIAGNOSIS:          Secondary infertility.  PROCEDURES:                   Microscopic bilateral tubal reanastomosis.  HISTORY OF PRESENT ILLNESS:   The patient is a 40 year old gravida 2, para 2 female status post laparoscopic tubal sterilization who desires bilateral tubal reanastomosis due to a new relationship.  She had previously undergone evaluation to include laparoscopy, which showed normal distal segments of 5 cm and proximal segments of 1.5 cm on one side and 0.5 cm on the other. ______ showed three proximal segments bilaterally.  The risks, benefits, indications, and alternatives were discussed with her, as well as the alternatives for in vitro fertilization and she has decided to proceed with the surgery.  HOSPITAL COURSE:              The patient was admitted on August 31, 1999, where microscopic bilateral tubal reanastomosis was performed by Dr. Audie Box and assisted by Dr. Eda Paschal.  Under general anesthesia, the patient tolerated the procedure well.  Postoperative course was uncomplicated.  She remained afebrile.  She was able to be discharged on her first postoperative day in satisfactory condition.  FOLLOW-UP:                    Follow up in the office in two days.  DISCHARGE MEDICATIONS:        Tylox for pain. DD:  09/14/99 TD:  09/17/99 Job: [id number]

## 2010-10-18 ENCOUNTER — Other Ambulatory Visit: Payer: Self-pay | Admitting: Obstetrics and Gynecology

## 2010-10-18 DIAGNOSIS — R928 Other abnormal and inconclusive findings on diagnostic imaging of breast: Secondary | ICD-10-CM

## 2010-10-23 ENCOUNTER — Ambulatory Visit
Admission: RE | Admit: 2010-10-23 | Discharge: 2010-10-23 | Disposition: A | Discharge status: Home / Self Care | Payer: 59 | Source: Ambulatory Visit | Attending: Obstetrics and Gynecology | Admitting: Obstetrics and Gynecology

## 2010-10-23 DIAGNOSIS — R928 Other abnormal and inconclusive findings on diagnostic imaging of breast: Secondary | ICD-10-CM

## 2010-11-09 LAB — CBC
Hemoglobin: 10.4 — ABNORMAL LOW
RBC: 4.71
RDW: 16.9 — ABNORMAL HIGH

## 2010-11-15 LAB — DIFFERENTIAL
Basophils Absolute: 0
Eosinophils Absolute: 0
Lymphocytes Relative: 14
Monocytes Relative: 4
Neutro Abs: 12.8 — ABNORMAL HIGH
Neutrophils Relative %: 82 — ABNORMAL HIGH

## 2010-11-15 LAB — PROTIME-INR
INR: 0.9
Prothrombin Time: 12.5

## 2010-11-15 LAB — SAMPLE TO BLOOD BANK

## 2010-11-15 LAB — BASIC METABOLIC PANEL
BUN: 11
Calcium: 8.6
GFR calc non Af Amer: >60
Potassium: 3.3 — ABNORMAL LOW

## 2010-11-15 LAB — CBC
HCT: 28.8 — ABNORMAL LOW
Platelets: 321
WBC: 15.6 — ABNORMAL HIGH

## 2010-11-15 LAB — APTT: aPTT: 25

## 2010-11-27 LAB — BASIC METABOLIC PANEL
Calcium: 9.5
Creatinine, Ser: 0.75
GFR calc Af Amer: >60
GFR calc non Af Amer: >60
Sodium: 138

## 2010-11-27 LAB — CBC
Hemoglobin: 10.9 — ABNORMAL LOW
RBC: 4.78

## 2010-12-05 LAB — CBC
Hemoglobin: 11.8 — ABNORMAL LOW
MCHC: 32.4
MCV: 68.9 — ABNORMAL LOW
RBC: 5.28 — ABNORMAL HIGH
WBC: 8.3

## 2012-09-01 ENCOUNTER — Other Ambulatory Visit: Payer: Self-pay | Admitting: Rehabilitation

## 2012-09-01 DIAGNOSIS — M545 Low back pain: Secondary | ICD-10-CM

## 2012-09-07 ENCOUNTER — Other Ambulatory Visit: Payer: 59

## 2014-09-20 ENCOUNTER — Other Ambulatory Visit: Payer: Self-pay | Admitting: Obstetrics and Gynecology

## 2014-09-21 LAB — CYTOLOGY - PAP

## 2014-10-26 DIAGNOSIS — J309 Allergic rhinitis, unspecified: Principal | ICD-10-CM

## 2014-10-26 DIAGNOSIS — H1010 Acute atopic conjunctivitis, unspecified eye: Secondary | ICD-10-CM | POA: Insufficient documentation

## 2014-10-26 DIAGNOSIS — R51 Headache: Secondary | ICD-10-CM

## 2014-10-26 DIAGNOSIS — R519 Headache, unspecified: Secondary | ICD-10-CM | POA: Insufficient documentation

## 2014-11-29 ENCOUNTER — Other Ambulatory Visit: Payer: Self-pay

## 2015-01-16 ENCOUNTER — Other Ambulatory Visit: Payer: Self-pay | Admitting: *Deleted

## 2015-01-16 MED ORDER — MONTELUKAST SODIUM 10 MG PO TABS: 10.0000 mg | ORAL_TABLET | Freq: Every day | ORAL | Status: DC

## 2015-03-22 ENCOUNTER — Other Ambulatory Visit: Payer: Self-pay | Admitting: Obstetrics and Gynecology

## 2015-03-22 DIAGNOSIS — R1032 Left lower quadrant pain: Secondary | ICD-10-CM

## 2015-03-24 ENCOUNTER — Other Ambulatory Visit: Payer: Self-pay

## 2015-03-29 ENCOUNTER — Other Ambulatory Visit: Payer: Self-pay

## 2015-04-06 ENCOUNTER — Ambulatory Visit
Admission: RE | Admit: 2015-04-06 | Discharge: 2015-04-06 | Disposition: A | Discharge status: Home / Self Care | Payer: 59 | Source: Ambulatory Visit | Attending: Obstetrics and Gynecology | Admitting: Obstetrics and Gynecology

## 2015-04-06 DIAGNOSIS — R1032 Left lower quadrant pain: Secondary | ICD-10-CM

## 2015-04-06 MED ORDER — IOPAMIDOL (ISOVUE-300) INJECTION 61%
100.0000 mL | Freq: Once | INTRAVENOUS | Status: AC | PRN
Start: 2015-04-06 — End: 2015-04-06
  Administered 2015-04-06: 100 mL via INTRAVENOUS

## 2015-04-20 ENCOUNTER — Ambulatory Visit: Payer: 59 | Admitting: Gynecologic Oncology

## 2015-04-20 DIAGNOSIS — E785 Hyperlipidemia, unspecified: Secondary | ICD-10-CM

## 2015-04-20 DIAGNOSIS — R079 Chest pain, unspecified: Secondary | ICD-10-CM

## 2015-04-20 DIAGNOSIS — R002 Palpitations: Secondary | ICD-10-CM | POA: Insufficient documentation

## 2015-04-20 HISTORY — DX: Palpitations: R00.2

## 2015-04-20 HISTORY — DX: Hyperlipidemia, unspecified: E78.5

## 2015-04-20 HISTORY — DX: Chest pain, unspecified: R07.9

## 2015-04-24 ENCOUNTER — Ambulatory Visit: Payer: 59 | Attending: Gynecologic Oncology | Admitting: Gynecologic Oncology

## 2015-04-24 ENCOUNTER — Encounter: Payer: Self-pay | Admitting: Gynecologic Oncology

## 2015-04-24 VITALS — BP 126/83 | HR 74 | Temp 97.4°F | Resp 18 | Ht 64.0 in | Wt 163.9 lb

## 2015-04-24 DIAGNOSIS — N83202 Unspecified ovarian cyst, left side: Secondary | ICD-10-CM | POA: Diagnosis not present

## 2015-04-24 DIAGNOSIS — G8929 Other chronic pain: Secondary | ICD-10-CM | POA: Insufficient documentation

## 2015-04-24 DIAGNOSIS — E663 Overweight: Secondary | ICD-10-CM | POA: Insufficient documentation

## 2015-04-24 DIAGNOSIS — Z90710 Acquired absence of both cervix and uterus: Secondary | ICD-10-CM | POA: Diagnosis not present

## 2015-04-24 DIAGNOSIS — R102 Pelvic and perineal pain: Secondary | ICD-10-CM | POA: Insufficient documentation

## 2015-04-24 DIAGNOSIS — N83292 Other ovarian cyst, left side: Secondary | ICD-10-CM | POA: Diagnosis not present

## 2015-04-24 DIAGNOSIS — E7800 Pure hypercholesterolemia, unspecified: Secondary | ICD-10-CM | POA: Insufficient documentation

## 2015-04-24 HISTORY — DX: Unspecified ovarian cyst, left side: N83.202
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
## 2015-04-24 NOTE — Patient Instructions (Signed)
Preparing for your Surgery  Plan for surgery on April 18 with Dr. Everitt Amber.  You will be scheduled for a robotic assisted left salpingo-oophorectomy, possible right salpingo-oophorectomy.    Pre-operative Testing -You will receive a phone call from presurgical testing at [facility] to arrange for a pre-operative testing appointment before your surgery.  This appointment normally occurs one to two weeks before your scheduled surgery.   -Bring your insurance card, copy of an advanced directive if applicable, medication list  -At that visit, you will be asked to sign a consent for a possible blood transfusion in case a transfusion becomes necessary during surgery.  The need for a blood transfusion is rare but having consent is a necessary part of your care.     -You should not be taking blood thinners or aspirin at least ten days prior to surgery unless instructed by your surgeon.  Day Before Surgery at [place] will be asked to take in only clear liquids the day before surgery.  Examples of clear liquids include broths, jello, and clear juices.  Avoid carbonated beverages.  You will be advised to have nothing to eat or drink after midnight the evening before. **Plan to take two bottles of magnesium citrate the day before surgery**   Your role in recovery Your role is to become active as soon as directed by your doctor, while still giving yourself time to heal.  Rest when you feel tired. You will be asked to do the following in order to speed your recovery:  - Cough and breathe deeply. This helps toclear and expand your lungs and can prevent pneumonia. You may be given a spirometer to practice deep breathing. A staff member will show you how to use the spirometer. - Do mild physical activity. Walking or moving your legs help your circulation and body functions return to normal. A staff member will help you when you try to walk and will provide you with simple exercises. Do  not try to get up or walk alone the first time. - Actively manage your pain. Managing your pain lets you move in comfort. We will ask you to rate your pain on a scale of zero to 10. It is your responsibility to tell your doctor or nurse where and how much you hurt so your pain can be treated.  Special Considerations -If you are diabetic, you may be placed on insulin after surgery to have closer control over your blood sugars to promote healing and recovery.  This does not mean that you will be discharged on insulin.  If applicable, your oral antidiabetics will be resumed when you are tolerating a solid diet.  -Your final pathology results from surgery should be available by the Friday after surgery and the results will be relayed to you when available.  Blood Transfusion Information WHAT IS A BLOOD TRANSFUSION? A transfusion is the replacement of blood or some of its parts. Blood is made up of multiple cells which provide different functions.  Red blood cells carry oxygen and are used for blood loss replacement.  White blood cells fight against infection.  Platelets control bleeding.  Plasma helps clot blood.  Other blood products are available for specialized needs, such as hemophilia or other clotting disorders. BEFORE THE TRANSFUSION  Who gives blood for transfusions?   You may be able to donate blood to be used at a later date on yourself (autologous donation).  Relatives can be asked to donate blood. This is generally  not any safer than if you have received blood from a stranger. The same precautions are taken to ensure safety when a relative's blood is donated.  Healthy volunteers who are fully evaluated to make sure their blood is safe. This is blood bank blood. Transfusion therapy is the safest it has ever been in the practice of medicine. Before blood is taken from a donor, a complete history is taken to make sure that person has no history of diseases nor engages in risky  social behavior (examples are intravenous drug use or sexual activity with multiple partners). The donor's travel history is screened to minimize risk of transmitting infections, such as malaria. The donated blood is tested for signs of infectious diseases, such as HIV and hepatitis. The blood is then tested to be sure it is compatible with you in order to minimize the chance of a transfusion reaction. If you or a relative donates blood, this is often done in anticipation of surgery and is not appropriate for emergency situations. It takes many days to process the donated blood. RISKS AND COMPLICATIONS Although transfusion therapy is very safe and saves many lives, the main dangers of transfusion include:   Getting an infectious disease.  Developing a transfusion reaction. This is an allergic reaction to something in the blood you were given. Every precaution is taken to prevent this. The decision to have a blood transfusion has been considered carefully by your caregiver before blood is given. Blood is not given unless the benefits outweigh the risks.

## 2015-04-27 ENCOUNTER — Encounter: Payer: Self-pay | Admitting: Allergy and Immunology

## 2015-04-27 ENCOUNTER — Ambulatory Visit (INDEPENDENT_AMBULATORY_CARE_PROVIDER_SITE_OTHER): Payer: 59 | Admitting: Allergy and Immunology

## 2015-04-27 VITALS — BP 112/68 | HR 60 | Resp 14 | Ht 62.4 in | Wt 161.4 lb

## 2015-04-27 DIAGNOSIS — H6981 Other specified disorders of Eustachian tube, right ear: Secondary | ICD-10-CM

## 2015-04-27 DIAGNOSIS — J309 Allergic rhinitis, unspecified: Secondary | ICD-10-CM

## 2015-04-27 DIAGNOSIS — H1010 Acute atopic conjunctivitis, unspecified eye: Secondary | ICD-10-CM | POA: Diagnosis not present

## 2015-04-27 MED ORDER — BEPOTASTINE BESILATE 1.5 % OP SOLN: OPHTHALMIC | Status: DC

## 2015-04-27 MED ORDER — METHYLPREDNISOLONE ACETATE 80 MG/ML IJ SUSP
80.0000 mg | Freq: Once | INTRAMUSCULAR | Status: AC
  Administered 2015-04-27: 80 mg via INTRAMUSCULAR

## 2015-04-27 NOTE — Patient Instructions (Signed)
  1. Depo-Medrol 80 mg delivered in the clinic today  2. OTC Flonase 1-2 sprays each nostril one time per day  3. Continue montelukast 10 mg daily  4. Continue Allegra 180 one tablet one time per day  5. Can use a Bepreve drop each eye twice a day. Coupon.  6. Can use nasal saline if needed  7. Consider immunotherapy if Spring is difficult  8. Return to clinic in 6 months or earlier if problem

## 2015-04-27 NOTE — Progress Notes (Signed)
Follow-up Note  Referring Provider: No ref. provider found Primary Provider: Wende Neighbors, MD Date of Office Visit: 04/27/2015  Subjective:   [name] (DOB: Sep 17, 1970) is a 45 y.o. female who returns to the [facility] [place] on 04/27/2015 in re-evaluation of the following:  HPI Comments: [name] returns to this clinic in reevaluation of her allergic rhinoconjunctivitis, history of chronic cephalgia, intermittent vertigo, and lack of sweating. Since the spring has arrived she's had a little bit more problem with her nose being congested and she has this right ear fullness over the course of the past several days. He doesn't like to use the Rhinocort. She would like to try some Flonase. She does continue on montelukast and Allegra. She has had evaluation with a cardiologist and has had a stress test this Tuesday thinking that some of her intermittent vertigo issue may be tied up with a cardiac source. However, her stress test was normal. She still continues to have this intermittent dizziness and vertigo. There does not appear to be a correlation between the onset of headaches and the development of this problem. She probably had a little left-sided facial irritation yesterday and she's wondering is from one of the drugs that she used for the stress test. This is resolved. She's not had any fever with this most recent episode nor ugly nasal discharge.    Outpatient Prescriptions Prior to Visit  Medication Sig Dispense Refill  . ALPRAZolam (XANAX PO) Take by mouth as needed.    . Cholecalciferol (VITAMIN D PO) Take by mouth daily.    . fexofenadine (ALLEGRA) 180 MG tablet Take 180 mg by mouth daily as needed for allergies or rhinitis.    . MECLIZINE HCL PO Take by mouth as needed.    . montelukast (SINGULAIR) 10 MG tablet Take 1 tablet (10 mg total) by mouth at bedtime. 30 tablet 2  . omeprazole (PRILOSEC) 40 MG capsule Take 40 mg by mouth daily.    . Ondansetron HCl  (ZOFRAN PO) Take by mouth as needed.    Marland Kitchen oxyCODONE-acetaminophen (PERCOCET/ROXICET) 5-325 MG tablet Take by mouth.    . rosuvastatin (CRESTOR) 10 MG tablet Take 10 mg by mouth daily.    . Nitrofurantoin Monohyd Macro (MACROBID PO) Take by mouth as needed.     No facility-administered medications prior to visit.    Past Medical History  Diagnosis Date  . Allergic rhinoconjunctivitis   . Cephalgia     Past Surgical History  Procedure Laterality Date  . Cholecystectomy    . Abdominal hysterectomy  2012  . Oophorectomy Left 2012    Allergies  Allergen Reactions  . Keflex [Cephalexin] Nausea Only  . Zithromax [Azithromycin] Nausea Only    Review of systems negative except as noted in HPI / PMHx or noted below:  Review of Systems  Constitutional: Negative.   HENT: Negative.   Eyes: Negative.   Respiratory: Negative.   Cardiovascular: Negative.   Gastrointestinal: Negative.   Genitourinary: Negative.   Musculoskeletal: Negative.   Skin: Negative.   Neurological: Negative.   Endo/Heme/Allergies: Negative.   Psychiatric/Behavioral: Negative.      Objective:   Filed Vitals:   04/27/15 1541  BP: 112/68  Pulse: 60  Resp: 14   Height: 5' 2.4" (158.5 cm)  Weight: 161 lb 6 oz (73.2 kg)   Physical Exam  Constitutional: She is well-developed, well-nourished, and in no distress.  HENT:  Head: Normocephalic. Head is without right periorbital erythema and without  left periorbital erythema.  Right Ear: External ear and ear canal normal. A middle ear effusion is present.  Left Ear: Tympanic membrane, external ear and ear canal normal.  Nose: Mucosal edema present. No rhinorrhea.  Mouth/Throat: Oropharynx is clear and moist and mucous membranes are normal. No oropharyngeal exudate.  Eyes: Conjunctivae and lids are normal. Pupils are equal, round, and reactive to light.  Neck: Trachea normal. No tracheal deviation present. No thyromegaly present.  Cardiovascular: Normal  rate, regular rhythm, S1 normal, S2 normal and normal heart sounds.   No murmur heard. Pulmonary/Chest: Effort normal. No stridor. No tachypnea. No respiratory distress. She has no wheezes. She has no rales. She exhibits no tenderness.  Musculoskeletal: She exhibits no edema or tenderness.  Lymphadenopathy:       Head (right side): No tonsillar adenopathy present.       Head (left side): No tonsillar adenopathy present.    She has no cervical adenopathy.    She has no axillary adenopathy.  Neurological: She is alert. Gait normal.  Skin: No rash noted. She is not diaphoretic. No erythema. No pallor. Nails show no clubbing.  Psychiatric: Mood and affect normal.    Diagnostics: None     Assessment and Plan:   1. Allergic rhinoconjunctivitis   2. ETD (eustachian tube dysfunction), right     1. Depo-Medrol 80 mg delivered in the clinic today  2. OTC Flonase 1-2 sprays each nostril one time per day  3. Continue montelukast 10 mg daily  4. Continue Allegra 180 one tablet one time per day  5. Can use a Bepreve drop each eye twice a day. Coupon.  6. Can use nasal saline if needed  7. Consider immunotherapy if Spring is difficult  8. Return to clinic in 6 months or earlier if problem  It does appear as though mellisa has a fair amount inflammation of her respiratory tract and certainly her eustation tube and we'll treat her with anti-inflammatory medications including systemic steroids and get her back on a nasal steroid was she continues on her montelukast and Allegra. Certainly if the spring becomes a difficult issue in the face of this therapy should be a candidate for immunotherapy and I once again give her literature on this form of treatment during today's visit. We'll see her back in this clinic in approximately 6 months or earlier if there is a problem.  [name], MD [place]

## 2015-05-01 ENCOUNTER — Telehealth: Payer: Self-pay | Admitting: Allergy and Immunology

## 2015-05-01 NOTE — Telephone Encounter (Signed)
Please inform patient that it may take 2-3 weeks for it to completely resolve. Let us know response in 2-3 weeks.

## 2015-05-01 NOTE — Telephone Encounter (Signed)
Spoke with pt, she said she will call us back if she is not better in the next 2-3 weeks.

## 2015-05-01 NOTE — Telephone Encounter (Signed)
Called to let Dr.Kozlow know that she still has the popping noise in her ear. Is there anything else Dr. Neldon Mc can do?

## 2015-05-02 NOTE — Progress Notes (Signed)
Cardiac clearance received from [facility] [place]  [facility]: [phone] ) PCP Dr Wende Neighbors , patient surgery scheduled for April 18 , 2017 with Dr Everitt Amber Nuclear Cardiology report faxed to Pre surgical testing

## 2015-05-07 ENCOUNTER — Other Ambulatory Visit: Payer: Self-pay | Admitting: Allergy and Immunology

## 2015-05-09 ENCOUNTER — Telehealth: Payer: Self-pay | Admitting: Gynecologic Oncology

## 2015-05-09 NOTE — Telephone Encounter (Signed)
Left message with patient's home number.  Returning her call about the mag citrate bowel prep.

## 2015-05-09 NOTE — Telephone Encounter (Signed)
Was able to get in touch base with patient on her cell.  Going to start mag citrate bowel prep at 12.

## 2015-05-18 ENCOUNTER — Telehealth: Payer: Self-pay | Admitting: *Deleted

## 2015-05-18 NOTE — Telephone Encounter (Signed)
Please inform patient that the next step would be to see a ENT doctor to see if a ear ventilation tub would be appropriate. Has she seen a ENT in past? If not, can refer to [facility], Dr. Thornell Mule, Dr Ernesto Rutherford.

## 2015-05-18 NOTE — Telephone Encounter (Signed)
Pt says she still has popping in her right ear. Would like a return call

## 2015-05-18 NOTE — Telephone Encounter (Signed)
Spoke with pt she is going to call dr. Gaylyn Cheers and make appointment as she is a old pt of his. I will fax over notes to them.

## 2015-05-25 NOTE — Patient Instructions (Addendum)
[name]  05/25/2015   Your procedure is scheduled on: June 06, 2015  Report to [facility] Main  Entrance take [facility]  elevators to 3rd floor to  [place] at 5:30 AM.  Call this number if you have problems the morning of surgery [phone]   Remember: ONLY 1 PERSON MAY GO WITH YOU TO SHORT STAY TO GET  READY MORNING OF [place].  Do not eat food after midnight Sunday night.  Then follow clear liquid until midnight Monday night.  Then nothing by mouth.              Bowel prep as instructed by physician     Take these medicines the morning of surgery with A SIP OF WATER: Prilosec, Allegra and Flonase if needed DO NOT TAKE ANY DIABETIC MEDICATIONS DAY OF YOUR SURGERY                               You may not have any metal on your body including hair pins and              piercings  Do not wear jewelry, make-up, lotions, powders or perfumes, deodorant             Do not wear nail polish.  Do not shave  48 hours prior to surgery.              Do not bring valuables to the hospital. [place].  Contacts, dentures or bridgework may not be worn into surgery.  Leave suitcase in the car. After surgery it may be brought to your room.     Patients discharged the day of surgery will not be allowed to drive home.  Name and phone number of your driver:  Special Instructions:coughing and deep breathing exercises, leg exercises              Please read over the following fact sheets you were given: _____________________________________________________________________             [facility] - Preparing for Surgery Before surgery, you can play an important role.  Because skin is not sterile, your skin needs to be as free of germs as possible.  You can reduce the number of germs on your skin by washing with CHG (chlorahexidine gluconate) soap before surgery.  CHG is an antiseptic cleaner which  kills germs and bonds with the skin to continue killing germs even after washing. Please DO NOT use if you have an allergy to CHG or antibacterial soaps.  If your skin becomes reddened/irritated stop using the CHG and inform your nurse when you arrive at Short Stay. Do not shave (including legs and underarms) for at least 48 hours prior to the first CHG shower.  You may shave your face/neck. Please follow these instructions carefully:  1.  Shower with CHG Soap the night before surgery and the  morning of Surgery.  2.  If you choose to wash your hair, wash your hair first as usual with your  normal  shampoo.  3.  After you shampoo, rinse your hair and body thoroughly to remove the  shampoo.  4.  Use CHG as you would any other liquid soap.  You can apply chg directly  to the skin and wash                       Gently with a scrungie or clean washcloth.  5.  Apply the CHG Soap to your body ONLY FROM THE NECK DOWN.   Do not use on face/ open                           Wound or open sores. Avoid contact with eyes, ears mouth and genitals (private parts).                       Wash face,  Genitals (private parts) with your normal soap.             6.  Wash thoroughly, paying special attention to the area where your surgery  will be performed.  7.  Thoroughly rinse your body with warm water from the neck down.  8.  DO NOT shower/wash with your normal soap after using and rinsing off  the CHG Soap.                9.  Pat yourself dry with a clean towel.            10.  Wear clean pajamas.            11.  Place clean sheets on your bed the night of your first shower and do not  sleep with pets. Day of Surgery : Do not apply any lotions/deodorants the morning of surgery.  Please wear clean clothes to the hospital/surgery center.  FAILURE TO FOLLOW THESE INSTRUCTIONS MAY RESULT IN THE CANCELLATION OF YOUR SURGERY PATIENT SIGNATURE_________________________________  NURSE  SIGNATURE__________________________________  ________________________________________________________________________  WHAT IS A BLOOD TRANSFUSION? Blood Transfusion Information  A transfusion is the replacement of blood or some of its parts. Blood is made up of multiple cells which provide different functions.  Red blood cells carry oxygen and are used for blood loss replacement.  White blood cells fight against infection.  Platelets control bleeding.  Plasma helps clot blood.  Other blood products are available for specialized needs, such as hemophilia or other clotting disorders. BEFORE THE TRANSFUSION  Who gives blood for transfusions?   Healthy volunteers who are fully evaluated to make sure their blood is safe. This is blood bank blood. Transfusion therapy is the safest it has ever been in the practice of medicine. Before blood is taken from a donor, a complete history is taken to make sure that person has no history of diseases nor engages in risky social behavior (examples are intravenous drug use or sexual activity with multiple partners). The donor's travel history is screened to minimize risk of transmitting infections, such as malaria. The donated blood is tested for signs of infectious diseases, such as HIV and hepatitis. The blood is then tested to be sure it is compatible with you in order to minimize the chance of a transfusion reaction. If you or a relative donates blood, this is often done in anticipation of surgery and is not appropriate for emergency situations. It takes many days to process the donated blood. RISKS AND COMPLICATIONS Although transfusion therapy is very safe and saves many lives, the main dangers of transfusion include:   Getting an infectious disease.  Developing a transfusion reaction. This  is an allergic reaction to something in the blood you were given. Every precaution is taken to prevent this. The decision to have a blood transfusion has been  considered carefully by your caregiver before blood is given. Blood is not given unless the benefits outweigh the risks. AFTER THE TRANSFUSION  Right after receiving a blood transfusion, you will usually feel much better and more energetic. This is especially true if your red blood cells have gotten low (anemic). The transfusion raises the level of the red blood cells which carry oxygen, and this usually causes an energy increase.  The nurse administering the transfusion will monitor you carefully for complications. HOME CARE INSTRUCTIONS  No special instructions are needed after a transfusion. You may find your energy is better. Speak with your caregiver about any limitations on activity for underlying diseases you may have. SEEK MEDICAL CARE IF:   Your condition is not improving after your transfusion.  You develop redness or irritation at the intravenous (IV) site. SEEK IMMEDIATE MEDICAL CARE IF:  Any of the following symptoms occur over the next 12 hours:  Shaking chills.  You have a temperature by mouth above 102 F (38.9 C), not controlled by medicine.  Chest, back, or muscle pain.  People around you feel you are not acting correctly or are confused.  Shortness of breath or difficulty breathing.  Dizziness and fainting.  You get a rash or develop hives.  You have a decrease in urine output.  Your urine turns a dark color or changes to pink, red, or brown. Any of the following symptoms occur over the next 10 days:  You have a temperature by mouth above 102 F (38.9 C), not controlled by medicine.  Shortness of breath.  Weakness after normal activity.  The white part of the eye turns yellow (jaundice).  You have a decrease in the amount of urine or are urinating less often.  Your urine turns a dark color or changes to pink, red, or brown. Document Released: 02/02/2000 Document Revised: 04/29/2011 Document Reviewed: 09/21/2007 [facility] Patient Information 2014  [place], [place].  _______________________________________________________________________

## 2015-05-30 ENCOUNTER — Encounter (HOSPITAL_COMMUNITY): Payer: Self-pay

## 2015-05-30 ENCOUNTER — Encounter (HOSPITAL_COMMUNITY)
Admission: RE | Admit: 2015-05-30 | Discharge: 2015-05-30 | Disposition: A | Discharge status: Home / Self Care | Payer: 59 | Source: Ambulatory Visit | Attending: Gynecologic Oncology | Admitting: Gynecologic Oncology

## 2015-05-30 DIAGNOSIS — N8300 Follicular cyst of ovary, unspecified side: Secondary | ICD-10-CM | POA: Insufficient documentation

## 2015-05-30 DIAGNOSIS — Z01812 Encounter for preprocedural laboratory examination: Secondary | ICD-10-CM | POA: Insufficient documentation

## 2015-05-30 HISTORY — DX: Accidental discharge from unspecified firearms or gun, initial encounter: W34.00XA

## 2015-05-30 HISTORY — DX: Gastro-esophageal reflux disease without esophagitis: K21.9

## 2015-05-30 HISTORY — DX: Adverse effect of unspecified anesthetic, initial encounter: T41.45XA

## 2015-05-30 HISTORY — DX: Anxiety disorder, unspecified: F41.9

## 2015-05-30 HISTORY — DX: Other specified postprocedural states: R11.2

## 2015-05-30 HISTORY — DX: Other specified postprocedural states: Z98.890

## 2015-05-30 HISTORY — DX: Other complications of anesthesia, initial encounter: T88.59XA

## 2015-05-30 LAB — CBC WITH DIFFERENTIAL/PLATELET
BASOS ABS: 0 10*3/uL (ref 0.0–0.1)
Basophils Relative: 0 %
EOS ABS: 0.2 10*3/uL (ref 0.0–0.7)
EOS PCT: 2 %
HCT: 42.2 % (ref 36.0–46.0)
Hemoglobin: 14 g/dL (ref 12.0–15.0)
LYMPHS PCT: 41 %
Lymphs Abs: 3.5 10*3/uL (ref 0.7–4.0)
MCH: 28.1 pg (ref 26.0–34.0)
MCHC: 33.2 g/dL (ref 30.0–36.0)
MCV: 84.6 fL (ref 78.0–100.0)
MONO ABS: 0.6 10*3/uL (ref 0.1–1.0)
Monocytes Relative: 8 %
Neutro Abs: 4.2 10*3/uL (ref 1.7–7.7)
Neutrophils Relative %: 49 %
PLATELETS: 266 10*3/uL (ref 150–400)
RBC: 4.99 MIL/uL (ref 3.87–5.11)
RDW: 13 % (ref 11.5–15.5)
WBC: 8.6 10*3/uL (ref 4.0–10.5)

## 2015-05-30 LAB — URINALYSIS, ROUTINE W REFLEX MICROSCOPIC
BILIRUBIN URINE: NEGATIVE
Glucose, UA: NEGATIVE mg/dL
HGB URINE DIPSTICK: NEGATIVE
KETONES UR: NEGATIVE mg/dL
Leukocytes, UA: NEGATIVE
NITRITE: NEGATIVE
PROTEIN: NEGATIVE mg/dL
Specific Gravity, Urine: 1.025 (ref 1.005–1.030)
pH: 6 (ref 5.0–8.0)

## 2015-05-30 LAB — COMPREHENSIVE METABOLIC PANEL
ALT: 73 U/L — ABNORMAL HIGH (ref 14–54)
AST: 39 U/L (ref 15–41)
Albumin: 4.3 g/dL (ref 3.5–5.0)
Alkaline Phosphatase: 42 U/L (ref 38–126)
Anion gap: 8 (ref 5–15)
BUN: 21 mg/dL — ABNORMAL HIGH (ref 6–20)
CHLORIDE: 103 mmol/L (ref 101–111)
CO2: 29 mmol/L (ref 22–32)
Calcium: 10.3 mg/dL (ref 8.9–10.3)
Creatinine, Ser: 0.92 mg/dL (ref 0.44–1.00)
Glucose, Bld: 104 mg/dL — ABNORMAL HIGH (ref 65–99)
POTASSIUM: 5.1 mmol/L (ref 3.5–5.1)
SODIUM: 140 mmol/L (ref 135–145)
Total Bilirubin: 0.6 mg/dL (ref 0.3–1.2)
Total Protein: 7.4 g/dL (ref 6.5–8.1)

## 2015-05-30 NOTE — Progress Notes (Signed)
05-16-15 - EKG - in chart 04-25-15 - stress test - in chart 04-24-15 - LOV - Dr. Denman George (gyn.onc) - EPIC 04-20-15 - LOV - Dr. Geraldo Pitter (cardio) - Care Everywhere

## 2015-05-31 LAB — ABO/RH: ABO/RH(D): A POS

## 2015-06-05 ENCOUNTER — Telehealth: Payer: Self-pay | Admitting: Gynecologic Oncology

## 2015-06-05 NOTE — Anesthesia Preprocedure Evaluation (Addendum)
Anesthesia Evaluation  Patient identified by MRN, date of birth, ID band Patient awake    Reviewed: Allergy & Precautions, NPO status , Patient's Chart, lab work & pertinent test results  History of Anesthesia Complications (+) PONV and history of anesthetic complications  Airway Mallampati: II  TM Distance: >3 FB Neck ROM: Full    Dental  (+) Dental Advisory Given   Pulmonary neg pulmonary ROS,    breath sounds clear to auscultation       Cardiovascular negative cardio ROS   Rhythm:Regular Rate:Normal     Neuro/Psych Anxiety negative neurological ROS     GI/Hepatic Neg liver ROS, GERD  ,  Endo/Other  negative endocrine ROS  Renal/GU negative Renal ROS     Musculoskeletal negative musculoskeletal ROS (+)   Abdominal   Peds  Hematology negative hematology ROS (+)   Anesthesia Other Findings   Reproductive/Obstetrics                            Lab Results  Component Value Date   WBC 8.6 05/30/2015   HGB 14.0 05/30/2015   HCT 42.2 05/30/2015   MCV 84.6 05/30/2015   PLT 266 05/30/2015   Lab Results  Component Value Date   CREATININE 0.92 05/30/2015   BUN 21* 05/30/2015   NA 140 05/30/2015   K 5.1 05/30/2015   CL 103 05/30/2015   CO2 29 05/30/2015     Anesthesia Physical Anesthesia Plan  ASA: II  Anesthesia Plan: General   Post-op Pain Management:    Induction: Intravenous  Airway Management Planned: Oral ETT  Additional Equipment:   Intra-op Plan:   Post-operative Plan: Extubation in OR  Informed Consent: I have reviewed the patients History and Physical, chart, labs and discussed the procedure including the risks, benefits and alternatives for the proposed anesthesia with the patient or authorized representative who has indicated his/her understanding and acceptance.   Dental advisory given  Plan Discussed with: CRNA  Anesthesia Plan Comments:         Anesthesia Quick Evaluation

## 2015-06-05 NOTE — Telephone Encounter (Signed)
Patient called stating she will not be able to start her bowel prep until 1pm.  Advised to begin as soon as possible.  All questions answered.  Advised to call for any needs.

## 2015-06-06 ENCOUNTER — Encounter (HOSPITAL_COMMUNITY): Payer: Self-pay | Admitting: *Deleted

## 2015-06-06 ENCOUNTER — Ambulatory Visit (HOSPITAL_COMMUNITY): Payer: 59 | Admitting: Anesthesiology

## 2015-06-06 ENCOUNTER — Ambulatory Visit (HOSPITAL_COMMUNITY)
Admission: RE | Admit: 2015-06-06 | Discharge: 2015-06-06 | Disposition: A | Discharge status: Home / Self Care | Payer: 59 | Source: Ambulatory Visit | Attending: Gynecologic Oncology | Admitting: Gynecologic Oncology

## 2015-06-06 ENCOUNTER — Encounter (HOSPITAL_COMMUNITY)
Admission: RE | Disposition: A | Discharge status: Home / Self Care | Payer: Self-pay | Source: Ambulatory Visit | Attending: Gynecologic Oncology

## 2015-06-06 DIAGNOSIS — D271 Benign neoplasm of left ovary: Secondary | ICD-10-CM | POA: Diagnosis not present

## 2015-06-06 DIAGNOSIS — N801 Endometriosis of ovary: Secondary | ICD-10-CM | POA: Insufficient documentation

## 2015-06-06 DIAGNOSIS — N736 Female pelvic peritoneal adhesions (postinfective): Secondary | ICD-10-CM

## 2015-06-06 DIAGNOSIS — Z79891 Long term (current) use of opiate analgesic: Secondary | ICD-10-CM | POA: Insufficient documentation

## 2015-06-06 DIAGNOSIS — F419 Anxiety disorder, unspecified: Secondary | ICD-10-CM | POA: Diagnosis not present

## 2015-06-06 DIAGNOSIS — K219 Gastro-esophageal reflux disease without esophagitis: Secondary | ICD-10-CM | POA: Diagnosis not present

## 2015-06-06 DIAGNOSIS — R102 Pelvic and perineal pain: Secondary | ICD-10-CM | POA: Insufficient documentation

## 2015-06-06 DIAGNOSIS — Z79899 Other long term (current) drug therapy: Secondary | ICD-10-CM | POA: Diagnosis not present

## 2015-06-06 DIAGNOSIS — N83292 Other ovarian cyst, left side: Secondary | ICD-10-CM

## 2015-06-06 DIAGNOSIS — N83202 Unspecified ovarian cyst, left side: Secondary | ICD-10-CM | POA: Diagnosis present

## 2015-06-06 HISTORY — PX: ROBOTIC ASSISTED SALPINGO OOPHERECTOMY: SHX6082

## 2015-06-06 LAB — TYPE AND SCREEN
ABO/RH(D): A POS
ANTIBODY SCREEN: NEGATIVE

## 2015-06-06 SURGERY — SALPINGO-OOPHORECTOMY, ROBOT-ASSISTED
Anesthesia: General | Laterality: Left

## 2015-06-06 MED ORDER — ONDANSETRON HCL 4 MG/2ML IJ SOLN
INTRAMUSCULAR | Status: AC
Start: 2015-06-06 — End: 2015-06-06
  Filled 2015-06-06: qty 2

## 2015-06-06 MED ORDER — ROCURONIUM BROMIDE 100 MG/10ML IV SOLN
INTRAVENOUS | Status: AC
  Filled 2015-06-06: qty 1

## 2015-06-06 MED ORDER — OXYCODONE HCL 5 MG PO TABS: 5.0000 mg | ORAL_TABLET | ORAL | Status: DC | PRN

## 2015-06-06 MED ORDER — PROPOFOL 10 MG/ML IV BOLUS
INTRAVENOUS | Status: AC
  Filled 2015-06-06: qty 20

## 2015-06-06 MED ORDER — PROPOFOL 10 MG/ML IV BOLUS
INTRAVENOUS | Status: DC | PRN
  Administered 2015-06-06: 150 mg via INTRAVENOUS

## 2015-06-06 MED ORDER — SUGAMMADEX SODIUM 200 MG/2ML IV SOLN
INTRAVENOUS | Status: AC
  Filled 2015-06-06: qty 2

## 2015-06-06 MED ORDER — LACTATED RINGERS IR SOLN
Status: DC | PRN
  Administered 2015-06-06: 1000 mL

## 2015-06-06 MED ORDER — KETOROLAC TROMETHAMINE 15 MG/ML IJ SOLN: 15.0000 mg | Freq: Four times a day (QID) | INTRAMUSCULAR | Status: DC

## 2015-06-06 MED ORDER — SODIUM CHLORIDE 0.9 % IV SOLN: 250.0000 mL | INTRAVENOUS | Status: DC | PRN

## 2015-06-06 MED ORDER — SODIUM CHLORIDE 0.9% FLUSH: 3.0000 mL | INTRAVENOUS | Status: DC | PRN

## 2015-06-06 MED ORDER — DEXAMETHASONE SODIUM PHOSPHATE 10 MG/ML IJ SOLN
INTRAMUSCULAR | Status: DC | PRN
  Administered 2015-06-06: 10 mg via INTRAVENOUS

## 2015-06-06 MED ORDER — ROCURONIUM BROMIDE 100 MG/10ML IV SOLN
INTRAVENOUS | Status: DC | PRN
  Administered 2015-06-06: 40 mg via INTRAVENOUS

## 2015-06-06 MED ORDER — MIDAZOLAM HCL 2 MG/2ML IJ SOLN
INTRAMUSCULAR | Status: AC
  Filled 2015-06-06: qty 2

## 2015-06-06 MED ORDER — MIDAZOLAM HCL 5 MG/5ML IJ SOLN
INTRAMUSCULAR | Status: DC | PRN
  Administered 2015-06-06: 2 mg via INTRAVENOUS

## 2015-06-06 MED ORDER — SUCCINYLCHOLINE CHLORIDE 20 MG/ML IJ SOLN
INTRAMUSCULAR | Status: DC | PRN
  Administered 2015-06-06: 100 mg via INTRAVENOUS

## 2015-06-06 MED ORDER — PROMETHAZINE HCL 25 MG/ML IJ SOLN
6.2500 mg | Freq: Once | INTRAMUSCULAR | Status: AC
  Administered 2015-06-06: 6.25 mg via INTRAVENOUS
  Filled 2015-06-06: qty 1

## 2015-06-06 MED ORDER — SUGAMMADEX SODIUM 200 MG/2ML IV SOLN
INTRAVENOUS | Status: DC | PRN
Start: 2015-06-06 — End: 2015-06-06
  Administered 2015-06-06: 200 mg via INTRAVENOUS

## 2015-06-06 MED ORDER — ONDANSETRON HCL 4 MG/2ML IJ SOLN
INTRAMUSCULAR | Status: DC | PRN
  Administered 2015-06-06: 4 mg via INTRAVENOUS

## 2015-06-06 MED ORDER — LIDOCAINE HCL (CARDIAC) 20 MG/ML IV SOLN
INTRAVENOUS | Status: AC
  Filled 2015-06-06: qty 5

## 2015-06-06 MED ORDER — OXYCODONE-ACETAMINOPHEN 10-325 MG PO TABS: 1.0000 | ORAL_TABLET | Freq: Four times a day (QID) | ORAL | Status: DC | PRN

## 2015-06-06 MED ORDER — LACTATED RINGERS IV SOLN
INTRAVENOUS | Status: DC | PRN
  Administered 2015-06-06: 07:00:00 via INTRAVENOUS

## 2015-06-06 MED ORDER — LIDOCAINE HCL (CARDIAC) 20 MG/ML IV SOLN
INTRAVENOUS | Status: DC | PRN
  Administered 2015-06-06: 60 mg via INTRATRACHEAL

## 2015-06-06 MED ORDER — SCOPOLAMINE 1 MG/3DAYS TD PT72
MEDICATED_PATCH | TRANSDERMAL | Status: AC
  Filled 2015-06-06: qty 1

## 2015-06-06 MED ORDER — ACETAMINOPHEN 650 MG RE SUPP
650.0000 mg | RECTAL | Status: DC | PRN
  Filled 2015-06-06: qty 1

## 2015-06-06 MED ORDER — FENTANYL CITRATE (PF) 250 MCG/5ML IJ SOLN
INTRAMUSCULAR | Status: AC
  Filled 2015-06-06: qty 5

## 2015-06-06 MED ORDER — FENTANYL CITRATE (PF) 250 MCG/5ML IJ SOLN
INTRAMUSCULAR | Status: DC | PRN
  Administered 2015-06-06 (×3): 50 ug via INTRAVENOUS

## 2015-06-06 MED ORDER — ACETAMINOPHEN 325 MG PO TABS: 650.0000 mg | ORAL_TABLET | ORAL | Status: DC | PRN

## 2015-06-06 MED ORDER — DEXAMETHASONE SODIUM PHOSPHATE 10 MG/ML IJ SOLN
INTRAMUSCULAR | Status: AC
  Filled 2015-06-06: qty 1

## 2015-06-06 MED ORDER — HYDROMORPHONE HCL 1 MG/ML IJ SOLN
INTRAMUSCULAR | Status: AC
  Filled 2015-06-06: qty 1

## 2015-06-06 MED ORDER — STERILE WATER FOR IRRIGATION IR SOLN
Status: DC | PRN
  Administered 2015-06-06: 1000 mL

## 2015-06-06 MED ORDER — SCOPOLAMINE 1 MG/3DAYS TD PT72SCOPOLAMINE 1 MG/3DAYS
MEDICATED_PATCH | TRANSDERMAL | Status: DC | PRN
Start: 2015-06-06 — End: 2015-06-06
  Administered 2015-06-06: 1 via TRANSDERMAL

## 2015-06-06 MED ORDER — MORPHINE SULFATE (PF) 10 MG/ML IV SOLN: 2.0000 mg | INTRAVENOUS | Status: DC | PRN

## 2015-06-06 MED ORDER — HYDROMORPHONE HCL 1 MG/ML IJ SOLN
0.2500 mg | INTRAMUSCULAR | Status: DC | PRN
  Administered 2015-06-06 (×2): 0.5 mg via INTRAVENOUS

## 2015-06-06 MED ORDER — ROCURONIUM BROMIDE 100 MG/10ML IV SOLN: INTRAVENOUS | Status: DC | PRN

## 2015-06-06 MED ORDER — SODIUM CHLORIDE 0.9% FLUSH: 3.0000 mL | Freq: Two times a day (BID) | INTRAVENOUS | Status: DC

## 2015-06-06 MED ORDER — LACTATED RINGERS IV SOLN
INTRAVENOUS | Status: DC
  Administered 2015-06-06: 1000 mL via INTRAVENOUS

## 2015-06-06 MED ORDER — KETOROLAC TROMETHAMINE 30 MG/ML IJ SOLN
30.0000 mg | Freq: Once | INTRAMUSCULAR | Status: AC
  Administered 2015-06-06: 30 mg via INTRAVENOUS
  Filled 2015-06-06: qty 1

## 2015-06-06 MED ORDER — PROPOFOL 500 MG/50ML IV EMUL
INTRAVENOUS | Status: DC | PRN
  Administered 2015-06-06: 8 ug/kg/min via INTRAVENOUS

## 2015-06-06 SURGICAL SUPPLY — 45 items
BAG SPEC RTRVL LRG 6X4 10 (ENDOMECHANICALS) ×1
CHLORAPREP W/TINT 26ML (MISCELLANEOUS) ×2 IMPLANT
COVER SURGICAL LIGHT HANDLE (MISCELLANEOUS) ×2 IMPLANT
COVER TIP SHEARS 8 DVNC (MISCELLANEOUS) ×1 IMPLANT
COVER TIP SHEARS 8MM DA VINCI (MISCELLANEOUS) ×1
DRAPE ARM DVNC X/XI (DISPOSABLE) ×4 IMPLANT
DRAPE COLUMN DVNC XI (DISPOSABLE) ×1 IMPLANT
DRAPE DA VINCI XI ARM (DISPOSABLE) ×4
DRAPE DA VINCI XI COLUMN (DISPOSABLE) ×1
DRAPE SHEET LG 3/4 BI-LAMINATE (DRAPES) ×4 IMPLANT
DRAPE SURG IRRIG POUCH 19X23 (DRAPES) ×2 IMPLANT
ELECT REM PT RETURN 9FT ADLT (ELECTROSURGICAL) ×2
ELECTRODE REM PT RTRN 9FT ADLT (ELECTROSURGICAL) ×1 IMPLANT
GLOVE BIO SURGEON STRL SZ 6 (GLOVE) ×8 IMPLANT
GLOVE BIO SURGEON STRL SZ 6.5 (GLOVE) ×4 IMPLANT
GOWN STRL REUS W/ TWL LRG LVL3 (GOWN DISPOSABLE) ×3 IMPLANT
GOWN STRL REUS W/TWL LRG LVL3 (GOWN DISPOSABLE) ×6
HOLDER FOLEY CATH W/STRAP (MISCELLANEOUS) ×2 IMPLANT
KIT BASIN OR (CUSTOM PROCEDURE TRAY) ×2 IMPLANT
LIQUID BAND (GAUZE/BANDAGES/DRESSINGS) ×2 IMPLANT
MANIPULATOR UTERINE 4.5 ZUMI (MISCELLANEOUS) IMPLANT
MARKER SKIN DUAL TIP RULER LAB (MISCELLANEOUS) ×2 IMPLANT
OBTURATOR XI 8MM BLADELESS (TROCAR) ×2 IMPLANT
OCCLUDER COLPOPNEUMO (BALLOONS) IMPLANT
PORT ACCESS TROCAR AIRSEAL 12 (TROCAR) ×1 IMPLANT
PORT ACCESS TROCAR AIRSEAL 5M (TROCAR) ×1
POUCH ENDO CATCH II 15MM (MISCELLANEOUS) IMPLANT
POUCH SPECIMEN RETRIEVAL 10MM (ENDOMECHANICALS) ×1 IMPLANT
SEAL CANN UNIV 5-8 DVNC XI (MISCELLANEOUS) ×4 IMPLANT
SEAL XI 5MM-8MM UNIVERSAL (MISCELLANEOUS) ×4
SET TRI-LUMEN FLTR TB AIRSEAL (TUBING) IMPLANT
SET TUBE IRRIG SUCTION NO TIP (IRRIGATION / IRRIGATOR) ×2 IMPLANT
SHEET LAVH (DRAPES) ×2 IMPLANT
SOLUTION ELECTROLUBE (MISCELLANEOUS) ×2 IMPLANT
SUT MNCRL AB 4-0 PS2 18 (SUTURE) ×4 IMPLANT
SUT VIC AB 0 CT1 27 (SUTURE)
SUT VIC AB 0 CT1 27XBRD ANTBC (SUTURE) IMPLANT
SYR 50ML LL SCALE MARK (SYRINGE) IMPLANT
TOWEL OR 17X26 10 PK STRL BLUE (TOWEL DISPOSABLE) ×3 IMPLANT
TOWEL OR NON WOVEN STRL DISP B (DISPOSABLE) ×2 IMPLANT
TRAP SPECIMEN MUCOUS 40CC (MISCELLANEOUS) ×1 IMPLANT
TRAY FOLEY W/METER SILVER 14FR (SET/KITS/TRAYS/PACK) ×2 IMPLANT
TRAY LAPAROSCOPIC (CUSTOM PROCEDURE TRAY) ×2 IMPLANT
TROCAR BLADELESS OPT 5 100 (ENDOMECHANICALS) ×2 IMPLANT
WATER STERILE IRR 1500ML POUR (IV SOLUTION) ×1 IMPLANT

## 2015-06-06 NOTE — Anesthesia Postprocedure Evaluation (Signed)
Anesthesia Post Note  Patient: [name]  Procedure(s) Performed: Procedure(s) (LRB): XI ROBOTIC ASSISTED LEFT SALPINGO OOPHORECTOMY with Lysis of adhesions (Left)  Patient location during evaluation: PACU Anesthesia Type: General Level of consciousness: awake and alert Pain management: pain level controlled Vital Signs Assessment: post-procedure vital signs reviewed and stable Respiratory status: spontaneous breathing, nonlabored ventilation, respiratory function stable and patient connected to nasal cannula oxygen Cardiovascular status: blood pressure returned to baseline and stable Postop Assessment: no signs of nausea or vomiting Anesthetic complications: no    Last Vitals:  Filed Vitals:   06/06/15 1013 06/06/15 1132  BP: 112/63 119/63  Pulse: 68 72  Temp: 36.4 C 36.5 C  Resp: 16     Last Pain:  Filed Vitals:   06/06/15 1133  PainSc: 4                  Tiajuana Amass

## 2015-06-06 NOTE — Anesthesia Procedure Notes (Signed)
Procedure Name: Intubation Date/Time: 06/06/2015 7:28 AM Performed by: Dione Booze Pre-anesthesia Checklist: Emergency Drugs available, Suction available, Patient being monitored and Patient identified Patient Re-evaluated:Patient Re-evaluated prior to inductionOxygen Delivery Method: Circle system utilized Preoxygenation: Pre-oxygenation with 100% oxygen Intubation Type: IV induction Laryngoscope Size: Mac and 4 Grade View: Grade I Tube type: Oral Tube size: 7.5 mm Number of attempts: 1 Airway Equipment and Method: Stylet Placement Confirmation: ETT inserted through vocal cords under direct vision and positive ETCO2 Secured at: 20 cm Tube secured with: Tape Dental Injury: Teeth and Oropharynx as per pre-operative assessment

## 2015-06-06 NOTE — Op Note (Signed)
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  Assistant Surgeon: Lahoma Crocker M.D. (an MD assistant was necessary for tissue manipulation, management of robotic instrumentation, retraction and positioning due to the complexity of the case and hospital policies).   Anesthesia: Gen. endotracheal.  Specimens: Left ovaries, fallopian tube, pelvic washings  Estimated Blood Loss: <20 mL. Blood Replacement: None  Complications: none  Indication for Procedure:  The patietn had a 2cm left ovarian cyst on CT scan and chronic left pelvic pain. History of prior hysterectomy and partial left oophorectomy (partial due to severe adhesions to sigmoid).  Operative Findings: dense adhesions between sigmoid colon and the left ovary and left pelvic side wall. Normal appearing right tube and ovary.  Frozen pathology was consistent with benign ovary and tube.  Procedure: The patient's taken to the operating room and placed under general endotracheal anesthesia testing difficulty. She is placed in a dorsolithotomy position and cervical acromial pad was placed. The arms were tucked with care taken to pad the olecranon process. And prepped and draped in usual sterile fashion. A foley was placed. A 52mm incision was made in the left upper quadrant palmer's point and a 5 mm Optiview trocar used to enter the abdomen under direct visualization. With entry into the abdomen and then maintenance of 15 mm of mercury the patient was placed in Trendelenburg position. An incision was made in the umbilicus and a [id number] trochar was placed through this site. Two incisions were made lateral to the umbilical incision in the left and right abdomen measuring 47mm.  These incisions were made approximately 10 cm lateral to the umbilical incision. 8 mm robotic trochars were inserted. The robot was docked. The abdomen was inspected as was the pelvis.  Pelvic washings were obtained.  For 30 minutes sharp adhesiolysis was performed to separate the curtain of sigmoid colon and epiploica to the left pelvic side wall. The left ovary was identified in the left pelvis densely adherent to the left sigmoid mesentery. For 10 minutes sharp adhesiolysis was performed to mobilize the left ovary from the sigmoid. There was unavoidable cyst rupture (simple, unilocular) during manipulation.   An incision was made on the right pelvic side wall peritoneum parallel to the left IP ligament and the retroperitoneal space entered. The left ureter was identified and the para-rectal space was developed. A window was created in the right broad ligament above the ureter. The left ovary was elevated and the adhesions to the peritoneum were carefully dissected off to mobilize the ovary from the peritoneum and vaginal attachments. Specimen was placed in an Endo Catch bag. The abdomen was copiously irrigated and drained and all operative sites inspected and hemostasis was assured  The robot was undocked. The Endo Catch bag was delivered through the LUQ port. Frozen section was sent and returned benign.  The ports were all removed. The fascial closure at the left upper quadrant port was made with 0 Vicryl.  All incisions were closed with a running subcuticular Monocryl suture. Dermabond was applied. Sponge, lap and needle counts were correct x 3.    The patient had sequential compression devices for VTE prophylaxis.         Disposition: PACU -stable         Condition: stable  Kari Eva,  MD     

## 2015-06-06 NOTE — Transfer of Care (Signed)
Immediate Anesthesia Transfer of Care Note  Patient: [name]  Procedure(s) Performed: Procedure(s): XI ROBOTIC ASSISTED LEFT SALPINGO OOPHORECTOMY with Lysis of adhesions (Left)  Patient Location: PACU  Anesthesia Type:General  Level of Consciousness: awake, alert  and patient cooperative  Airway & Oxygen Therapy: Patient Spontanous Breathing and Patient connected to face mask oxygen  Post-op Assessment: Report given to RN and Post -op Vital signs reviewed and stable  Post vital signs: Reviewed and stable  Last Vitals:  Filed Vitals:   06/06/15 0524  BP: 131/91  Pulse: 61  Temp: 36.4 C  Resp: 16    Complications: No apparent anesthesia complications

## 2015-06-06 NOTE — Discharge Instructions (Signed)
Unilateral Salpingo-Oophorectomy, Care After °Refer to this sheet in the next few weeks. These instructions provide you with information on caring for yourself after your procedure. Your health care provider may also give you more specific instructions. Your treatment has been planned according to current medical practices, but problems sometimes occur. Call your health care provider if you have any problems or questions after your procedure. °WHAT TO EXPECT AFTER THE PROCEDURE °After your procedure, it is typical to have the following: °· Abdominal pain that can be controlled with pain medicine. °· Vaginal spotting. °· Constipation. °HOME CARE INSTRUCTIONS  °· Get plenty of rest and sleep. °· Only take over-the-counter or prescription medicines as directed by your health care provider. Do not take aspirin. It can cause bleeding. °· Keep incision areas clean and dry. Remove or change any bandages (dressings) only as directed by your health care provider. °· Follow your health care provider's advice regarding diet. °· Drink enough fluids to keep your urine clear or pale yellow. °· Limit exercise and activities as directed by your health care provider. Do not lift anything heavier than 5 pounds (2.3 kg) until your health care provider approves. °· Do not drive until your health care provider approves. °· Do not drink alcohol until your health care provider approves. °· Do not have sexual intercourse until your health care provider says it is OK. °· Take your temperature twice a day and write it down. °· If you become constipated, you may: °¨ Ask your health care provider about taking a mild laxative. °¨ Add more fruit and bran to your diet. °¨ Drink more fluids. °· Follow up with your health care provider as directed. °SEEK MEDICAL CARE IF:  °· You have swelling or redness in the incision area. °· You develop a rash. °· You feel lightheaded. °· You have pain that is not controlled with medicine. °· You have pain,  swelling, or redness where the IV access tube was placed. °SEEK IMMEDIATE MEDICAL CARE IF: °· You have a fever. °· You develop increasing abdominal pain. °· You see pus coming out of the incision, or the incision is separating. °· You notice a bad smell coming from the wound or dressing. °· You have excessive vaginal bleeding. °· You feel sick to your stomach (nauseous) and vomit. °· You have leg or chest pain. °· You have pain when you urinate. °· You develop shortness of breath. °· You pass out. °  °This information is not intended to replace advice given to you by your health care provider. Make sure you discuss any questions you have with your health care provider. °  °Document Released: 12/01/2008 Document Revised: 11/25/2012 Document Reviewed: 07/29/2012 °Elsevier Interactive Patient Education ©2016 [employer] ° °
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
## 2015-06-07 ENCOUNTER — Telehealth: Payer: Self-pay | Admitting: *Deleted

## 2015-06-07 NOTE — Telephone Encounter (Signed)
Called pt to notify of scheduled f/u appointment with Dr. Denman George. Pt is scheduled for May 15 @ 3:15. Pt was advised to arrive at 2:30 to register. Pt agreed with time and date of appointment

## 2015-06-08 ENCOUNTER — Telehealth: Payer: Self-pay

## 2015-06-08 ENCOUNTER — Telehealth: Payer: Self-pay | Admitting: Gynecologic Oncology

## 2015-06-08 NOTE — Telephone Encounter (Signed)
Orders received from [facility] to contact the patient to update with surgical pathology report  "negative for malignancy" and to see how the patient was doing postoperatively . Attempted to contact the patient , no answer , left a detailed message with call back information provided if additional questions or concerns a arise. Also reminded the patient of her post-op follow up MD appointment scheduled for May 15 , 2017 at 3:15 PM.

## 2015-06-08 NOTE — Telephone Encounter (Signed)
Patient returned call to the office.  Stating she is doing well post-op.  Informed of final path results.  Patient reporting sore throat.  Advised she could use cephacol lozenges over the counter or throat spray.  Follow up appt arranged.  Advised to call for any needs or concerns.

## 2015-06-29 ENCOUNTER — Telehealth: Payer: Self-pay

## 2015-06-29 DIAGNOSIS — E8940 Asymptomatic postprocedural ovarian failure: Secondary | ICD-10-CM

## 2015-06-29 MED ORDER — ESTRADIOL 0.1 MG/24HR TD PTWK: 0.1000 mg | MEDICATED_PATCH | TRANSDERMAL | Status: DC

## 2015-06-29 NOTE — Telephone Encounter (Signed)
Patient called with complaints of "severe" hot flashes associated with insomnia and sweating . Sanjana Cross, APNP updated , orders received : Climara 0/1 MG /24 hr patch , apply to the skin , change once weekly. QTY: 4 with 12 refills. Discussed instructions with the patient , patient statews understanding , denies further questions at this time.

## 2015-06-30 ENCOUNTER — Other Ambulatory Visit: Payer: Self-pay

## 2015-06-30 DIAGNOSIS — E8940 Asymptomatic postprocedural ovarian failure: Secondary | ICD-10-CM

## 2015-06-30 MED ORDER — ESTRADIOL 0.1 MG/24HR TD PTWK: 0.1000 mg | MEDICATED_PATCH | TRANSDERMAL | Status: DC

## 2015-07-03 ENCOUNTER — Encounter: Payer: Self-pay | Admitting: Gynecologic Oncology

## 2015-07-03 ENCOUNTER — Ambulatory Visit: Payer: 59 | Attending: Gynecologic Oncology | Admitting: Gynecologic Oncology

## 2015-07-03 VITALS — BP 123/87 | HR 85 | Temp 98.0°F | Resp 18 | Ht 64.0 in | Wt 161.7 lb

## 2015-07-03 DIAGNOSIS — N801 Endometriosis of ovary: Secondary | ICD-10-CM | POA: Diagnosis present

## 2015-07-03 DIAGNOSIS — E8941 Symptomatic postprocedural ovarian failure: Secondary | ICD-10-CM

## 2015-07-03 DIAGNOSIS — E8940 Asymptomatic postprocedural ovarian failure: Secondary | ICD-10-CM

## 2015-07-03 DIAGNOSIS — N83292 Other ovarian cyst, left side: Secondary | ICD-10-CM | POA: Diagnosis not present

## 2015-07-03 DIAGNOSIS — N83202 Unspecified ovarian cyst, left side: Secondary | ICD-10-CM

## 2015-07-03 DIAGNOSIS — Z9889 Other specified postprocedural states: Secondary | ICD-10-CM | POA: Diagnosis not present

## 2015-07-03 DIAGNOSIS — N736 Female pelvic peritoneal adhesions (postinfective): Secondary | ICD-10-CM | POA: Diagnosis not present

## 2015-07-03 DIAGNOSIS — N951 Menopausal and female climacteric states: Secondary | ICD-10-CM | POA: Diagnosis not present

## 2015-07-03 HISTORY — DX: Symptomatic postprocedural ovarian failure: E89.41

## 2015-07-03 MED ORDER — ESTRADIOL 0.1 MG/24HR TD PTWK: 0.1000 mg | MEDICATED_PATCH | TRANSDERMAL | Status: DC

## 2015-07-03 NOTE — Patient Instructions (Signed)
Please call for any questions or concerns. 

## 2015-07-04 ENCOUNTER — Encounter: Payer: Self-pay | Admitting: Gynecologic Oncology

## 2015-07-04 NOTE — Progress Notes (Signed)
POSTOPERATIVE FOLLOW-UP: GYN ONC  HPI:  [name] is a 45 y.o. year old initially seen in consultation on 04/24/15 for pelvic pain and left ovarian remnant syndrome.  She then underwent a robotic assisted lysis of adhesions and LSO on 0000000 without complications.  Her postoperative course was uncomplicated.  Her final pathology revealed a benign left tube and ovary with evidence of endometriosis and an adenofibroma. We did not remove the patient's normal appearing right ovary.  She is seen today for a postoperative check and to discuss her pathology results and ongoing plan.  Since discharge from the hospital, she is feeling well.  She has improving appetite, normal bowel and bladder function, and pain controlled with minimal PO medication. She has severe post-menopausal vasomotor symptoms particularly at night. These are quality of life limiting and restrict her ability to sleep. She has no other complaints today.    Review of systems: Constitutional:  + hot flashes Eyes: No blurred vision Ears, Nose, Mouth, Throat: No dizziness, headaches or changes in hearing. No mouth sores. Cardiovascular: No chest pain, palpitations or edema. + hot flashes Respiratory:  No shortness of breath, wheezing or cough Gastrointestinal: She has normal bowel movements without diarrhea or constipation. She denies any nausea or vomiting. She denies blood in her stool or heart burn. Genitourinary:  She denies pelvic pain, pelvic pressure or changes in her urinary function. She has no hematuria, dysuria, or incontinence. She has no irregular vaginal bleeding or vaginal discharge Musculoskeletal: Denies muscle weakness or joint pains.  Skin:  She has no skin changes, rashes or itching Neurological:  Denies dizziness or headaches. No neuropathy, no numbness or tingling. Psychiatric:  She denies depression or anxiety. Hematologic/Lymphatic:   No easy bruising or bleeding   Physical Exam: Blood pressure  123/87, pulse 85, temperature 98 F (36.7 C), temperature source Oral, resp. rate 18, height 5\' 4"  (1.626 m), weight 161 lb 11.2 oz (73.347 kg), SpO2 98 %. General: Well dressed, well nourished in no apparent distress.   HEENT:  Normocephalic and atraumatic, no lesions.  Extraocular muscles intact. Sclerae anicteric. Pupils equal, round, reactive. No mouth sores or ulcers. Thyroid is normal size, not nodular, midline. Abdomen:  Soft, nontender, nondistended.  No palpable masses.  No hepatosplenomegaly.  No ascites. Normal bowel sounds.  No hernias.  Incisions are well healed Genitourinary: deferred. Extremities: No cyanosis, clubbing or edema.  No calf tenderness or erythema. No palpable cords. Psychiatric: Mood and affect are appropriate. Neurological: Awake, alert and oriented x 3. Sensation is intact, no neuropathy.  Musculoskeletal: No pain, normal strength and range of motion.  Assessment:    45 y.o. year old with left ovarian endometriosis.   S/p robotic assisted lysis of adhesions and left salpingo-oophorectomy on 06/06/15.   Plan: 1) Pathology reports reviewed today 2) Treatment counseling - For her hot flashes we prescribed estradiol patch. I discussed risks including VTE/stroke/MI. I discussed an increased risk for breast cancer with >5years use after age of menopause.  She was given the opportunity to ask questions, which were answered to her satisfaction, and she is agreement with the above mentioned plan of care.  3)  Return to clinic on a prn basis. She will follow-up with Dr Matthew Saras for routine annual well-woman care.  [name], MD

## 2015-07-12 ENCOUNTER — Other Ambulatory Visit: Payer: Self-pay

## 2015-07-12 ENCOUNTER — Telehealth: Payer: Self-pay

## 2015-07-12 DIAGNOSIS — N951 Menopausal and female climacteric states: Secondary | ICD-10-CM

## 2015-07-12 MED ORDER — ESTRADIOL 0.1 MG/24HR TD PTTW: 1.0000 | MEDICATED_PATCH | TRANSDERMAL | Status: DC

## 2015-07-12 NOTE — Telephone Encounter (Signed)
Patient called requesting to change Estradiol 0.1 MG patch changes weekly to Minivelle 0.1 MG patch changes 2 times weekly . Updated [name], APNP with the patient request . "OK" to change . Orders placed , patient aware that her request has been approved , patient denies further questions at this time.

## 2015-07-14 ENCOUNTER — Telehealth: Payer: Self-pay

## 2015-07-14 DIAGNOSIS — N83202 Unspecified ovarian cyst, left side: Secondary | ICD-10-CM

## 2015-07-14 DIAGNOSIS — N951 Menopausal and female climacteric states: Secondary | ICD-10-CM

## 2015-07-14 MED ORDER — ESTRADIOL 0.1 MG/24HR TD PTTW: 1.0000 | MEDICATED_PATCH | TRANSDERMAL | Status: DC

## 2015-07-14 NOTE — Telephone Encounter (Signed)
Patient's call returned to update Dr Everitt Amber that she wants to have her Vivelle-Dot  0.73mh/24 HR Patch is a $ 100.00 co-pay through mail order and states she can get it at [facility] for $ 45.00 for a 3 month supply . Will change her pharmacy to [facility] and call the prescription in as requested . Patient contacted again to verify pharmacy , patient states she made a mistake and [facility] [place] , [place] on [address]. [facility] [place] , [place] on [address] [phone] as requested.

## 2015-09-18 DIAGNOSIS — H9312 Tinnitus, left ear: Secondary | ICD-10-CM

## 2015-09-18 DIAGNOSIS — R42 Dizziness and giddiness: Secondary | ICD-10-CM | POA: Insufficient documentation

## 2015-09-18 HISTORY — DX: Dizziness and giddiness: R42

## 2015-09-18 HISTORY — DX: Tinnitus, left ear: H93.12

## 2015-10-19 ENCOUNTER — Ambulatory Visit: Payer: 59 | Admitting: Neurology

## 2015-10-19 ENCOUNTER — Encounter: Payer: Self-pay | Admitting: Neurology

## 2015-11-01 ENCOUNTER — Ambulatory Visit: Payer: 59 | Admitting: Neurology

## 2015-11-02 ENCOUNTER — Ambulatory Visit (INDEPENDENT_AMBULATORY_CARE_PROVIDER_SITE_OTHER): Payer: 59 | Admitting: Neurology

## 2015-11-02 ENCOUNTER — Encounter: Payer: Self-pay | Admitting: Neurology

## 2015-11-02 VITALS — BP 136/94 | HR 72 | Ht 63.0 in | Wt 163.8 lb

## 2015-11-02 DIAGNOSIS — G450 Vertebro-basilar artery syndrome: Secondary | ICD-10-CM

## 2015-11-02 DIAGNOSIS — R42 Dizziness and giddiness: Secondary | ICD-10-CM
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
## 2015-11-02 NOTE — Patient Instructions (Signed)
I had a long discussion with the patient about her recurrent transient episodes of dizziness and lightheadedness being of unclear etiology. He has had a negative ENT and cardiac evaluation in the past. I recommend checking MRI scan of the brain, MRA of the brain and neck for vertebrobasilar insufficiency as well as a 30 day heart monitor for paroxysmal arrhythmias. I have encouraged her to maintain adequate hydration. No specific medications indicated at the present time. She will return for follow-up in 2 months or call earlier if necessary.

## 2015-11-14 ENCOUNTER — Ambulatory Visit (INDEPENDENT_AMBULATORY_CARE_PROVIDER_SITE_OTHER): Payer: 59

## 2015-11-14 DIAGNOSIS — R42 Dizziness and giddiness: Secondary | ICD-10-CM

## 2015-11-16 ENCOUNTER — Telehealth: Payer: Self-pay | Admitting: Neurology

## 2015-11-16 NOTE — Telephone Encounter (Signed)
Pt called said she is not able to tolerate the heart monitor. She said the tabs are breaking her out. She is wanting to turn it back in and proceed with MRI's.

## 2015-11-16 NOTE — Telephone Encounter (Signed)
I spoke to pt. She reports that her heart monitor is "not going to kick it." It is "breaking her out". I advised her that Dr. Leonie Man is looking for problems in her heart and it is important for her to complete the test. She says "I am turning it in regardless of what he says." She says that she had a stress test in March and it was normal, so she doesn't think she has heart problems. She says that she is going to mail her heart monitor back to "[employer]" and wants her MRIs to be scheduled.  I advised her that Dr. Leonie Man is not here and that I would send this information to the [place] physician and our office will call her back if anything further needs to be discussed. Pt verbalized understanding.

## 2015-11-16 NOTE — Telephone Encounter (Signed)
She is free to return the monitor to the company!   Is this an allergy to tape? To the surface contact of the monitor and her skin?  She should ask the company for paper type tape.  CD

## 2015-11-16 NOTE — Telephone Encounter (Signed)
She says that she is not only "breaking out" from the tape, but she is very annoyed by the wires and stated that it "keeps falling out."

## 2015-11-24 DIAGNOSIS — R42 Dizziness and giddiness: Secondary | ICD-10-CM | POA: Diagnosis not present

## 2015-11-28 ENCOUNTER — Other Ambulatory Visit: Payer: Self-pay | Admitting: Neurology

## 2015-11-28 DIAGNOSIS — G450 Vertebro-basilar artery syndrome: Secondary | ICD-10-CM

## 2015-11-28 DIAGNOSIS — R42 Dizziness and giddiness: Secondary | ICD-10-CM

## 2015-11-29 ENCOUNTER — Telehealth: Payer: Self-pay | Admitting: Adult Health

## 2015-11-29 NOTE — Telephone Encounter (Signed)
This patient has no orders for an MRI.

## 2015-11-29 NOTE — Telephone Encounter (Signed)
I called the patient and gave her the results of MRI scan of the brain and MRA showing no worrisome finding. 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 She also expressed to me her inability to wear the heart monitor and she discontinued it and sent it back after a few days. I do not feel further efforts to monitor rhythm and necessary. We will await the results of the few days apart vomiting that she has done. She voiced understanding.

## 2015-11-29 NOTE — Telephone Encounter (Signed)
Patient advised of MRI/MRA results

## 2015-11-29 NOTE — Telephone Encounter (Signed)
Message sent to Dr. Leonie Man. Pt had Mr brain, MR head and angiogram of neck order 11/02/2015 and completed.

## 2015-12-12 ENCOUNTER — Telehealth: Payer: Self-pay | Admitting: Neurology

## 2015-12-12 NOTE — Telephone Encounter (Signed)
Rn call Shelly at [facility] who works with the heart monitors. Shelly stated the end of report for the monitor was not back.RN explain the patient only wore it a week because she was itching a lot with the leads. Darrick Penna will contact the company who does the report and notify us when its ready.

## 2015-12-12 NOTE — Telephone Encounter (Signed)
Rn call patient about heart monitor results. Rn stated to patient the results were not release to Dr. Leonie Man. Pt stated she only wore the heart monitor for a week. Pt did not want to schedule another appt until appointment until he had the results. Rn stated once the results are read she will be given a call. Pt verbalized understanding.

## 2015-12-12 NOTE — Telephone Encounter (Signed)
Patient is calling to get heart monitor results. °

## 2015-12-14 NOTE — Telephone Encounter (Signed)
Patient returned [name]'s call, advised patient per [name], heart monitor was normal per Dr. Leonie Man, did not show any irregular heartbeat.

## 2015-12-14 NOTE — Telephone Encounter (Signed)
LFt vm for patient to call back about results of cardiac monitor.

## 2016-03-04 ENCOUNTER — Ambulatory Visit: Payer: 59 | Admitting: Neurology

## 2016-03-20 ENCOUNTER — Other Ambulatory Visit: Payer: Self-pay | Admitting: Allergy and Immunology

## 2016-04-16 ENCOUNTER — Ambulatory Visit: Payer: 59 | Admitting: Neurology

## 2016-05-13 ENCOUNTER — Other Ambulatory Visit: Payer: Self-pay | Admitting: Allergy and Immunology

## 2016-05-29 ENCOUNTER — Ambulatory Visit (INDEPENDENT_AMBULATORY_CARE_PROVIDER_SITE_OTHER): Payer: 59 | Admitting: Neurology

## 2016-05-29 ENCOUNTER — Encounter: Payer: Self-pay | Admitting: Neurology

## 2016-05-29 ENCOUNTER — Encounter (INDEPENDENT_AMBULATORY_CARE_PROVIDER_SITE_OTHER): Payer: Self-pay

## 2016-05-29 VITALS — BP 116/77 | HR 76 | Wt 166.2 lb

## 2016-05-29 DIAGNOSIS — R42 Dizziness and giddiness: Secondary | ICD-10-CM | POA: Diagnosis not present

## 2016-05-29 NOTE — Patient Instructions (Signed)
I had a long discussion with the patient with regards to her dizzy spells which appear to have resolved. I reviewed MRI, MRA and Holter test results with  the patient and answered questions. 100 and bili further neurological testing or schedule follow-up is necessary at the present time. I encouraged her to increase participation in activities for stress relaxation and to follow-up with a psychiatrist for her anxiety issues. No follow-up appointment with me is necessary
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
## 2016-08-17 ENCOUNTER — Other Ambulatory Visit: Payer: Self-pay | Admitting: Allergy and Immunology

## 2016-09-20 ENCOUNTER — Other Ambulatory Visit: Payer: Self-pay | Admitting: Allergy and Immunology

## 2016-09-20 ENCOUNTER — Telehealth: Payer: Self-pay | Admitting: Allergy and Immunology

## 2016-09-20 MED ORDER — MONTELUKAST SODIUM 10 MG PO TABS: 10.0000 mg | ORAL_TABLET | Freq: Every day | ORAL | 0 refills | Status: DC

## 2016-09-20 NOTE — Telephone Encounter (Signed)
Patient called back and stated that her mail order pharmacy would not fill her prescription for a month it has to be 90 days and asked if she could move her appointment we offered her a 30 day supply at a local pharmacy and moved her appointment date to 09/26/2016. We sent in 30 supply to the [facility] in [place] and informed patient she could get the 90 day supple at her appointment.

## 2016-09-20 NOTE — Telephone Encounter (Signed)
Called patient and left message to inform her that she hasn't been seen in over a year however she has appointment on 10/31/2016 and I could send in 30 days to get her through till her appointment. If she has any questions to call the office.

## 2016-09-26 ENCOUNTER — Ambulatory Visit: Payer: Self-pay | Admitting: Allergy and Immunology

## 2016-10-19 ENCOUNTER — Other Ambulatory Visit: Payer: Self-pay | Admitting: Allergy and Immunology

## 2016-10-31 ENCOUNTER — Encounter: Payer: Self-pay | Admitting: Allergy and Immunology

## 2016-10-31 ENCOUNTER — Ambulatory Visit: Payer: 59 | Admitting: Allergy and Immunology

## 2016-10-31 ENCOUNTER — Ambulatory Visit (INDEPENDENT_AMBULATORY_CARE_PROVIDER_SITE_OTHER): Payer: 59 | Admitting: Allergy and Immunology

## 2016-10-31 VITALS — BP 118/68 | HR 72 | Resp 20

## 2016-10-31 DIAGNOSIS — J3089 Other allergic rhinitis: Secondary | ICD-10-CM | POA: Diagnosis not present

## 2016-10-31 MED ORDER — MONTELUKAST SODIUM 10 MG PO TABS: 10.0000 mg | ORAL_TABLET | Freq: Every day | ORAL | 0 refills | Status: DC

## 2016-10-31 MED ORDER — MONTELUKAST SODIUM 10 MG PO TABS: 10.0000 mg | ORAL_TABLET | Freq: Every day | ORAL | 3 refills | Status: DC

## 2016-10-31 NOTE — Patient Instructions (Signed)
  1. Continue montelukast 10 mg daily  2. Continue Allegra 180 one tablet one time per day  3. Can use nasal saline if needed  4. Consider immunotherapy if Spring is difficult  5. Return to clinic in 1 year or earlier if problem  6. Obtain fall flu vaccine
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
## 2017-02-25 ENCOUNTER — Telehealth: Payer: Self-pay | Admitting: Allergy and Immunology

## 2017-02-25 ENCOUNTER — Other Ambulatory Visit: Payer: Self-pay

## 2017-02-25 MED ORDER — MONTELUKAST SODIUM 10 MG PO TABS: 10.0000 mg | ORAL_TABLET | Freq: Every day | ORAL | 2 refills | Status: DC

## 2017-02-25 NOTE — Telephone Encounter (Signed)
90 Day Rx for Montelukast ( with 2 refills) sent to [facility] as requested.

## 2017-02-25 NOTE — Telephone Encounter (Signed)
[facility] called and asked that a 90 day prescription of SINGULAIR be sent in to them for [place].

## 2017-11-28 ENCOUNTER — Other Ambulatory Visit: Payer: Self-pay | Admitting: *Deleted

## 2017-11-28 MED ORDER — MONTELUKAST SODIUM 10 MG PO TABS: ORAL_TABLET | ORAL | 0 refills | Status: AC

## 2018-02-17 ENCOUNTER — Encounter (HOSPITAL_COMMUNITY): Payer: Self-pay | Admitting: *Deleted

## 2018-02-17 ENCOUNTER — Other Ambulatory Visit: Payer: Self-pay | Admitting: Urology

## 2018-02-17 NOTE — Progress Notes (Signed)
Patient instructed to arrive 0900 on 02/23/2018 to admitting. No aspirin or NSAIDS after 11 AM on 02/20/2018. NPO after midnight the night prior to the procedure. Reviewed need for laxative 02/22/2018 and light meal after that. Patient verbalizes understanding.

## 2018-02-20 NOTE — H&P (Signed)
The patient is a 48 year old female with a left renal pelvic stone.  She has passed a kidney stone in the past. She takes Macrodantin after intercourse to prevent bladder infections he  She voids every 2 hours and gets up twice a night.   She does not take daily aspirin or blood thinners. She takes daily Aleve. She has never smoked.   No neurologic issues.   Patient did not have microscopic hematuria today. Urine was sent for culture   cystoscopy normal   Patient will be worked up for gross hematuria. patient will see our nurse practitioner next week. She likely had a stone or has a stone. I will send a note the above provider    02/05/18: She returns today for follow up and to discuss CT imaging results. Impression is noted below:   IMPRESSION:  1. 7 mm right renal collecting system calculus. 2 mm left mid to  lower nonobstructive renal calculus. No other cause for hematuria  identified.  2. Lower lumbar degenerative disc disease.    Today she denies interval episodes of gross hematuria. She denies any ongoing abdominal or suprapubic pain. However, she does complain of right flank and back pain intermittently. She does note some increased frequency from baseline. She denies any dysuria, fever, chills, nausea, or vomiting. No difficulties voiding. She is not on blood thinners.     ALLERGIES: Keflex TABS - Nausea Zithromax Z-Pak TABS - Nausea    MEDICATIONS: Macrobid PRN  Prilosec  Aleve  Crestor 10 mg tablet Oral  Probiotic  Singulair  Vicodin Hp 10 mg-300 mg tablet Oral  Vitamin C  Xanax 1 mg tablet Oral     GU PSH: Cystoscopy - 01/30/2018 Hysterectomy Unilat SO - 2016, 2012 Locm 300-399Mg /Ml Iodine,1Ml - 02/03/2018      PSH Notes: Hysterectomy, Gallbladder Surgery   NON-GU PSH: Neck Surgery (Unspecified) - 2009 Remove Gallbladder - 2005 Remove Ovary(s) - 2017    GU PMH: Gross hematuria (Stable) - 01/30/2018, Gross hematuria, - 2016 Nocturia -  01/30/2018 Urinary Frequency - 01/30/2018 Abdominal Pain Unspec, Right flank pain - 2016    NON-GU PMH: Anxiety, Anxiety - 2016 Encounter for general adult medical examination without abnormal findings, Encounter for preventive health examination - 2016 Hypercholesterolemia, High cholesterol - 2016 Arthritis GERD    FAMILY HISTORY: 3 Son's - Other Colon Cancer - Mother Myocardial Infarction - Runs In Family Patient's father is still living - Other patient's mother is still living - Other Strokes - Runs In Family   SOCIAL HISTORY: Marital Status: Married Current Smoking Status: Patient has never smoked.   Tobacco Use Assessment Completed: Used Tobacco in last 30 days? Social Drinker.  Drinks 2 caffeinated drinks per day.     Notes: Alcohol use, Never a smoker, Married, Caffeine use   REVIEW OF SYSTEMS:    GU Review Female:   Patient reports frequent urination and get up at night to urinate. Patient denies hard to postpone urination, burning /pain with urination, leakage of urine, stream starts and stops, trouble starting your stream, have to strain to urinate, and being pregnant.  Gastrointestinal (Upper):   Patient denies nausea, vomiting, and indigestion/ heartburn.  Gastrointestinal (Lower):   Patient denies diarrhea and constipation.  Constitutional:   Patient denies fever, night sweats, weight loss, and fatigue.  Skin:   Patient denies skin rash/ lesion and itching.  Eyes:   Patient denies blurred vision and double vision.  Ears/ Nose/ Throat:   Patient denies sore throat and  sinus problems.  Hematologic/Lymphatic:   Patient denies swollen glands and easy bruising.  Cardiovascular:   Patient denies leg swelling and chest pains.  Respiratory:   Patient denies cough and shortness of breath.  Endocrine:   Patient denies excessive thirst.  Musculoskeletal:   Patient reports back pain. Patient denies joint pain.  Neurological:   Patient denies headaches and dizziness.   Psychologic:   Patient denies depression and anxiety.   VITAL SIGNS:    BP 143/96 mmHg  Pulse 76 /min  Temperature 98.2 F / 36.7 C   MULTI-SYSTEM PHYSICAL EXAMINATION:    Constitutional: Well-nourished. No physical deformities. Normally developed. Good grooming.  Respiratory: No labored breathing, no use of accessory muscles.   Cardiovascular: Normal temperature, normal extremity pulses, no swelling, no varicosities.  Neurologic / Psychiatric: Oriented to time, oriented to place, oriented to person. No depression, no anxiety, no agitation.  Gastrointestinal: No mass, no tenderness, no rigidity, non obese abdomen. No CVAT.   Musculoskeletal: Normal gait and station of head and neck.     PAST DATA REVIEWED:  Source Of History:  Patient  Urine Test Review:   Urinalysis, Urine Culture  Urodynamics Review:   Review Bladder Scan  X-Ray Review: C.T. Hematuria: Reviewed Films. Reviewed Report.     PROCEDURES:         KUB - [id number]  A single view of the abdomen is obtained. The right renal shadow is not well visualized due to overlying bowel gas. However, there appears to be an opacity consistent with calculus seen on CT imaging in the vicinity of right renal pelvis. No obvious opacities noted along the course of the right ureter. No obvious opacities noted within the confines of the left renal shadow or along the expected anatomical course of the left ureter. Prominent, but normal appearing bowel gas pattern.                Urinalysis w/Scope Dipstick Dipstick Cont'd Micro  Color: Yellow Bilirubin: Neg mg/dL WBC/hpf: 0 - 5/hpf  Appearance: Clear Ketones: Neg mg/dL RBC/hpf: 3 - 10/hpf  Specific Gravity: 1.025 Blood: Trace ery/uL Bacteria: Rare (0-9/hpf)  pH: 5.5 Protein: Neg mg/dL Cystals: NS (Not Seen)  Glucose: Neg mg/dL Urobilinogen: 0.2 mg/dL Casts: NS (Not Seen)    Nitrites: Neg Trichomonas: Not Present    Leukocyte Esterase: Neg leu/uL Mucous: Present      Epithelial Cells: 0 -  5/hpf      Yeast: NS (Not Seen)      Sperm: Not Present    ASSESSMENT:      ICD-10 Details  1 GU:   Gross hematuria - R31.0   2   Renal calculus - N20.0   3   Urinary Frequency - R35.0     Urinalysis today is without infectious parameters, but there is microscopic hematuria noted. I will repeat urine culture. We reviewed CT imaging findings in detail. She does have an approximatly 7 mm right renal pelvic stone. This is not currently causing any obstruction, but due to location may require intervention. We discussed both ESWL and URS in detail. KUB imaging today does show opacity consistent with calculus noted on CT imaging in the vicinity of the right renal pelvis.  He has elected to proceed with ESWL.  The procedure was discussed with her including its risks and complications, the alternatives and the probability of success as well as the anticipated postoperative course.  Urine cultures have been negative.

## 2018-02-20 NOTE — Discharge Instructions (Signed)

## 2018-02-23 ENCOUNTER — Ambulatory Visit (HOSPITAL_COMMUNITY): Payer: Managed Care, Other (non HMO)

## 2018-02-23 ENCOUNTER — Ambulatory Visit (HOSPITAL_COMMUNITY)
Admission: RE | Admit: 2018-02-23 | Discharge: 2018-02-23 | Disposition: A | Discharge status: Home / Self Care | Payer: Managed Care, Other (non HMO) | Attending: Urology | Admitting: Urology

## 2018-02-23 ENCOUNTER — Encounter (HOSPITAL_COMMUNITY): Payer: Self-pay | Admitting: General Practice

## 2018-02-23 ENCOUNTER — Encounter (HOSPITAL_COMMUNITY)
Admission: RE | Disposition: A | Discharge status: Home / Self Care | Payer: Self-pay | Source: Home / Self Care | Attending: Urology

## 2018-02-23 DIAGNOSIS — R310 Gross hematuria: Secondary | ICD-10-CM | POA: Insufficient documentation

## 2018-02-23 DIAGNOSIS — Z79899 Other long term (current) drug therapy: Secondary | ICD-10-CM | POA: Diagnosis not present

## 2018-02-23 DIAGNOSIS — M5136 Other intervertebral disc degeneration, lumbar region: Secondary | ICD-10-CM | POA: Diagnosis not present

## 2018-02-23 DIAGNOSIS — Z823 Family history of stroke: Secondary | ICD-10-CM | POA: Insufficient documentation

## 2018-02-23 DIAGNOSIS — Z8249 Family history of ischemic heart disease and other diseases of the circulatory system: Secondary | ICD-10-CM | POA: Diagnosis not present

## 2018-02-23 DIAGNOSIS — N200 Calculus of kidney: Secondary | ICD-10-CM | POA: Insufficient documentation

## 2018-02-23 DIAGNOSIS — Z87442 Personal history of urinary calculi: Secondary | ICD-10-CM | POA: Insufficient documentation

## 2018-02-23 DIAGNOSIS — Z881 Allergy status to other antibiotic agents status: Secondary | ICD-10-CM | POA: Insufficient documentation

## 2018-02-23 DIAGNOSIS — Z800 Family history of malignant neoplasm of digestive organs: Secondary | ICD-10-CM | POA: Diagnosis not present

## 2018-02-23 HISTORY — DX: Personal history of urinary calculi: Z87.442

## 2018-02-23 HISTORY — PX: EXTRACORPOREAL SHOCK WAVE LITHOTRIPSY: SHX1557

## 2018-02-23 SURGERY — LITHOTRIPSY, ESWL
Anesthesia: LOCAL | Laterality: Right

## 2018-02-23 MED ORDER — DIPHENHYDRAMINE HCL 25 MG PO CAPS
25.0000 mg | ORAL_CAPSULE | ORAL | Status: AC
  Administered 2018-02-23: 25 mg via ORAL
  Filled 2018-02-23: qty 1

## 2018-02-23 MED ORDER — ONDANSETRON HCL 4 MG PO TABS: 4.0000 mg | ORAL_TABLET | Freq: Every day | ORAL | 1 refills | Status: DC | PRN

## 2018-02-23 MED ORDER — DIAZEPAM 5 MG PO TABS
10.0000 mg | ORAL_TABLET | ORAL | Status: AC
  Administered 2018-02-23: 10 mg via ORAL
  Filled 2018-02-23: qty 2

## 2018-02-23 MED ORDER — CIPROFLOXACIN HCL 500 MG PO TABS
500.0000 mg | ORAL_TABLET | ORAL | Status: AC
  Administered 2018-02-23: 500 mg via ORAL
  Filled 2018-02-23: qty 1

## 2018-02-23 MED ORDER — SODIUM CHLORIDE 0.9 % IV SOLN
INTRAVENOUS | Status: DC
  Administered 2018-02-23: 10:00:00 via INTRAVENOUS

## 2018-02-23 MED ORDER — OXYCODONE-ACETAMINOPHEN 10-325 MG PO TABS: 1.0000 | ORAL_TABLET | ORAL | 0 refills | Status: DC | PRN

## 2018-02-23 MED ORDER — TAMSULOSIN HCL 0.4 MG PO CAPS: 0.4000 mg | ORAL_CAPSULE | ORAL | 0 refills | Status: DC

## 2018-02-23 NOTE — Op Note (Signed)
See [facility] OP note scanned into chart. Also because of the size, density, location and other factors that cannot be anticipated I feel this will likely be a staged procedure. This fact supersedes any indication in the scanned [facility] operative note to the contrary.  

## 2018-02-24 ENCOUNTER — Encounter (HOSPITAL_COMMUNITY): Payer: Self-pay | Admitting: Urology

## 2018-10-27 ENCOUNTER — Emergency Department (HOSPITAL_COMMUNITY)
Admission: EM | Admit: 2018-10-27 | Discharge: 2018-10-27 | Disposition: A | Discharge status: Home / Self Care | Payer: Managed Care, Other (non HMO) | Attending: Emergency Medicine | Admitting: Emergency Medicine

## 2018-10-27 ENCOUNTER — Other Ambulatory Visit: Payer: Self-pay

## 2018-10-27 ENCOUNTER — Emergency Department (HOSPITAL_COMMUNITY): Payer: Managed Care, Other (non HMO)

## 2018-10-27 DIAGNOSIS — R42 Dizziness and giddiness: Secondary | ICD-10-CM | POA: Diagnosis present

## 2018-10-27 DIAGNOSIS — Z5321 Procedure and treatment not carried out due to patient leaving prior to being seen by health care provider: Secondary | ICD-10-CM | POA: Diagnosis not present

## 2018-10-27 LAB — CBC
HCT: 46.3 % — ABNORMAL HIGH (ref 36.0–46.0)
Hemoglobin: 14.6 g/dL (ref 12.0–15.0)
MCH: 27 pg (ref 26.0–34.0)
MCHC: 31.5 g/dL (ref 30.0–36.0)
MCV: 85.6 fL (ref 80.0–100.0)
Platelets: 314 10*3/uL (ref 150–400)
RBC: 5.41 MIL/uL — ABNORMAL HIGH (ref 3.87–5.11)
RDW: 13.1 % (ref 11.5–15.5)
WBC: 9.6 10*3/uL (ref 4.0–10.5)
nRBC: 0 % (ref 0.0–0.2)

## 2018-10-27 LAB — BASIC METABOLIC PANEL
Anion gap: 12 (ref 5–15)
BUN: 14 mg/dL (ref 6–20)
CO2: 23 mmol/L (ref 22–32)
Calcium: 9.6 mg/dL (ref 8.9–10.3)
Chloride: 105 mmol/L (ref 98–111)
Creatinine, Ser: 0.7 mg/dL (ref 0.44–1.00)
GFR calc Af Amer: >60 mL/min (ref >60)
GFR calc non Af Amer: >60 mL/min (ref >60)
Glucose, Bld: 104 mg/dL — ABNORMAL HIGH (ref 70–99)
Potassium: 3.9 mmol/L (ref 3.5–5.1)
Sodium: 140 mmol/L (ref 135–145)

## 2018-10-27 LAB — TROPONIN I (HIGH SENSITIVITY): Troponin I (High Sensitivity): 4 ng/L (ref <18)

## 2018-10-27 LAB — I-STAT BETA HCG BLOOD, ED (MC, WL, AP ONLY): I-stat hCG, quantitative: <5 m[IU]/mL (ref <5)

## 2018-10-27 MED ORDER — SODIUM CHLORIDE 0.9% FLUSH: 3.0000 mL | Freq: Once | INTRAVENOUS | Status: DC

## 2018-10-27 NOTE — ED Triage Notes (Signed)
Patient sent from [facility] urgent care for further evaluation of chest pain over the last few days, intermittent, central, non-radiating, feels like acid reflux but worse. She also endorses intermittent dizziness past few days and headache onset this morning while at her dentist appointment. No known hx HTN, but she reports her BP at the dentist was 164/110. Denies vision changes, current chest pain or shortness of breath. Resp e/u, skin w/d.

## 2018-10-27 NOTE — ED Notes (Signed)
Pt states she can no longer wait and she is leaving AMA.

## 2018-10-28 LAB — POCT B-TYPE NATRIURETIC PEPTIDE (BNP): Pro B Natriuretic peptide (BNP): 2.5

## 2018-10-30 LAB — POCT B-TYPE NATRIURETIC PEPTIDE (BNP): Pro B Natriuretic peptide (BNP): 2.5

## 2018-12-14 DIAGNOSIS — Z20822 Contact with and (suspected) exposure to covid-19: Secondary | ICD-10-CM

## 2018-12-14 DIAGNOSIS — E785 Hyperlipidemia, unspecified: Secondary | ICD-10-CM | POA: Insufficient documentation

## 2018-12-14 DIAGNOSIS — I10 Essential (primary) hypertension: Secondary | ICD-10-CM | POA: Insufficient documentation

## 2018-12-14 DIAGNOSIS — F419 Anxiety disorder, unspecified: Secondary | ICD-10-CM | POA: Insufficient documentation

## 2018-12-14 HISTORY — DX: Contact with and (suspected) exposure to covid-19: Z20.822

## 2018-12-14 HISTORY — DX: Essential (primary) hypertension: I10

## 2018-12-18 ENCOUNTER — Other Ambulatory Visit: Payer: Self-pay | Admitting: Obstetrics and Gynecology

## 2018-12-18 DIAGNOSIS — R928 Other abnormal and inconclusive findings on diagnostic imaging of breast: Secondary | ICD-10-CM

## 2018-12-23 ENCOUNTER — Ambulatory Visit (INDEPENDENT_AMBULATORY_CARE_PROVIDER_SITE_OTHER): Payer: Managed Care, Other (non HMO) | Admitting: Cardiology

## 2018-12-23 ENCOUNTER — Other Ambulatory Visit: Payer: Self-pay

## 2018-12-23 ENCOUNTER — Encounter: Payer: Self-pay | Admitting: Cardiology

## 2018-12-23 VITALS — BP 122/66 | HR 61 | Ht 64.0 in | Wt 160.4 lb

## 2018-12-23 DIAGNOSIS — I10 Essential (primary) hypertension: Secondary | ICD-10-CM | POA: Diagnosis not present

## 2018-12-23 DIAGNOSIS — E782 Mixed hyperlipidemia: Secondary | ICD-10-CM | POA: Diagnosis not present

## 2018-12-23 DIAGNOSIS — R011 Cardiac murmur, unspecified: Secondary | ICD-10-CM

## 2018-12-23 HISTORY — DX: Cardiac murmur, unspecified: R01.1
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
## 2018-12-23 NOTE — Patient Instructions (Signed)
Medication Instructions:  Your physician recommends that you continue on your current medications as directed. Please refer to the Current Medication list given to you today.  *If you need a refill on your cardiac medications before your next appointment, please call your pharmacy*  Lab Work: NONE If you have labs (blood work) drawn today and your tests are completely normal, you will receive your results only by: Marland Kitchen MyChart Message (if you have MyChart) OR . A paper copy in the mail If you have any lab test that is abnormal or we need to change your treatment, we will call you to review the results.  Testing/Procedures: You had an EKG performed today  Your physician has requested that you have an echocardiogram. Echocardiography is a painless test that uses sound waves to create images of your heart. It provides your doctor with information about the size and shape of your heart and how well your heart's chambers and valves are working. This procedure takes approximately one hour. There are no restrictions for this procedure.  Follow-Up: At [facility], you and your health needs are our priority.  As part of our continuing mission to provide you with exceptional heart care, we have created designated Provider Care Teams.  These Care Teams include your primary Cardiologist (physician) and Advanced Practice Providers (APPs -  Physician Assistants and Nurse Practitioners) who all work together to provide you with the care you need, when you need it.  Your next appointment:   3 months  The format for your next appointment:   In Person  Provider:   Jyl Heinz, MD  Other Instructions  Echocardiogram An echocardiogram is a procedure that uses painless sound waves (ultrasound) to produce an image of the heart. Images from an echocardiogram can provide important information about:  Signs of coronary artery disease (CAD).  Aneurysm detection. An aneurysm is a weak or damaged part of  an artery wall that bulges out from the normal force of blood pumping through the body.  Heart size and shape. Changes in the size or shape of the heart can be associated with certain conditions, including heart failure, aneurysm, and CAD.  Heart muscle function.  Heart valve function.  Signs of a past heart attack.  Fluid buildup around the heart.  Thickening of the heart muscle.  A tumor or infectious growth around the heart valves. Tell a health care provider about:  Any allergies you have.  All medicines you are taking, including vitamins, herbs, eye drops, creams, and over-the-counter medicines.  Any blood disorders you have.  Any surgeries you have had.  Any medical conditions you have.  Whether you are pregnant or may be pregnant. What are the risks? Generally, this is a safe procedure. However, problems may occur, including:  Allergic reaction to dye (contrast) that may be used during the procedure. What happens before the procedure? No specific preparation is needed. You may eat and drink normally. What happens during the procedure?   An IV tube may be inserted into one of your veins.  You may receive contrast through this tube. A contrast is an injection that improves the quality of the pictures from your heart.  A gel will be applied to your chest.  A wand-like tool (transducer) will be moved over your chest. The gel will help to transmit the sound waves from the transducer.  The sound waves will harmlessly bounce off of your heart to allow the heart images to be captured in real-time motion. The images  will be recorded on a computer. The procedure may vary among health care providers and hospitals. What happens after the procedure?  You may return to your normal, everyday life, including diet, activities, and medicines, unless your health care provider tells you not to do that. Summary  An echocardiogram is a procedure that uses painless sound waves  (ultrasound) to produce an image of the heart.  Images from an echocardiogram can provide important information about the size and shape of your heart, heart muscle function, heart valve function, and fluid buildup around your heart.  You do not need to do anything to prepare before this procedure. You may eat and drink normally.  After the echocardiogram is completed, you may return to your normal, everyday life, unless your health care provider tells you not to do that. This information is not intended to replace advice given to you by your health care provider. Make sure you discuss any questions you have with your health care provider. Document Released: 02/02/2000 Document Revised: 05/28/2018 Document Reviewed: 03/09/2016 Elsevier Patient Education  2020 [employer].

## 2018-12-24 ENCOUNTER — Ambulatory Visit: Payer: Managed Care, Other (non HMO)

## 2018-12-24 ENCOUNTER — Ambulatory Visit
Admission: RE | Admit: 2018-12-24 | Discharge: 2018-12-24 | Disposition: A | Discharge status: Home / Self Care | Payer: Managed Care, Other (non HMO) | Source: Ambulatory Visit | Attending: Obstetrics and Gynecology | Admitting: Obstetrics and Gynecology

## 2018-12-24 DIAGNOSIS — R928 Other abnormal and inconclusive findings on diagnostic imaging of breast: Secondary | ICD-10-CM

## 2019-01-29 ENCOUNTER — Other Ambulatory Visit: Payer: Self-pay

## 2019-01-29 ENCOUNTER — Ambulatory Visit (INDEPENDENT_AMBULATORY_CARE_PROVIDER_SITE_OTHER): Payer: Managed Care, Other (non HMO)

## 2019-01-29 DIAGNOSIS — I10 Essential (primary) hypertension: Secondary | ICD-10-CM | POA: Diagnosis not present

## 2019-01-29 NOTE — Progress Notes (Signed)
  Echocardiogram 2D Echocardiogram has been performed.  [name] M 01/29/2019

## 2019-02-01 ENCOUNTER — Telehealth: Payer: Self-pay | Admitting: Cardiology

## 2019-02-01 NOTE — Telephone Encounter (Signed)
Please call patient with results of Echo and she had the echo doen to possibly go off BP meds so if normal she would like to stop them.

## 2019-02-03 NOTE — Telephone Encounter (Signed)
Patient call back and was informed of echo results. During the call she informed me that she stopped taking her metoprolol because she thought it was giving her a headache. She stopped taking 3 days ago. Her bp today was 133/88. She wants to know if she can come off of it for good. Will consult with Dr. Geraldo Pitter and follow up with patient.

## 2019-02-03 NOTE — Telephone Encounter (Signed)
She can do lifestyle modification with regular exercising and walking at least half an hour a day 5 days a week, salt restrictions in diet and keep a track of her blood pressures.  Send it to [place] in a week we will review it and let her know.

## 2019-02-04 NOTE — Telephone Encounter (Signed)
Left message for patient to return call.

## 2019-02-05 NOTE — Telephone Encounter (Signed)
Called patient. Informed her per Dr. Geraldo Pitter she can do a lifestyle modification, informed her her he advised to do regular exercising and walking at least 30 min per day for 5 days a week. She will keep a track of blood pressures for a week and then bring by the office. I let her know he will review and then let us know If she can come off metoprolol. She verbally understood. No further questions,

## 2019-02-10 ENCOUNTER — Other Ambulatory Visit: Payer: Managed Care, Other (non HMO)

## 2019-02-17 NOTE — Telephone Encounter (Signed)
Left message for patient to return call.

## 2019-02-18 NOTE — Telephone Encounter (Signed)
Called patient back. She reports she has not been keeping log of her blood pressure yet because her blood pressure cuff had to be replaced with new  batteries. She will begin checking now and bring the log next week. I advised her to make sure she checks daily and let us know if she notices it is increasing. She verbally understood. No further questions.

## 2019-02-18 NOTE — Telephone Encounter (Signed)
Patient is returning phone call.  °

## 2019-02-22 ENCOUNTER — Other Ambulatory Visit: Payer: Managed Care, Other (non HMO)

## 2019-02-22 ENCOUNTER — Telehealth: Payer: Self-pay | Admitting: Cardiology

## 2019-02-22 NOTE — Telephone Encounter (Signed)
Is going to go back on her bp medication

## 2019-02-23 NOTE — Telephone Encounter (Signed)
Patient called in and reports she is going back on her metoprolol. She was previously wanting to come off of it. However she reports her blood pressure was going back up so she went back on it. She didn't have an readings for me but states she feels better since starting it back. She will keep a log a let us know if she has concerns regarding them. No further questions.

## 2019-02-23 NOTE — Telephone Encounter (Signed)
Please see additional encounter. Patient choosing to go back on metoprolol.

## 2019-02-26 ENCOUNTER — Telehealth: Payer: Self-pay | Admitting: Cardiology

## 2019-02-26 NOTE — Telephone Encounter (Signed)
Her numbers are still high and she said she was told by you to call about her medication

## 2019-02-26 NOTE — Telephone Encounter (Signed)
Patient states her last BP 136/90's. She is asymptomatic without any headaches, swelling or SOB. Patient states she is concerned that her medications may need to be adjusted. Note routed to Dr. Docia Furl for advisement.

## 2019-02-26 NOTE — Telephone Encounter (Signed)
Reduce salt in diet, walk half hour daily and keep track of bp. Lower number must be below 80. Let us know in 2 wks

## 2019-03-02 NOTE — Telephone Encounter (Signed)
Called patient to go over Dr. Julien Nordmann recommendations. Patient verbally understood and had no further questions. I told her to call us if this changes. She thanked me.

## 2019-03-19 ENCOUNTER — Telehealth: Payer: Self-pay | Admitting: Cardiology

## 2019-03-19 NOTE — Telephone Encounter (Signed)
I have not seen them [facility]

## 2019-03-19 NOTE — Telephone Encounter (Signed)
Follow Up:   Pt said on Monday she dropped off her blood pressure readings  For 2 weeks. She says she have not heard anything from it.

## 2019-03-25 NOTE — Telephone Encounter (Signed)
Follow up     Pt is calling and is wondering if the BP readings have been reviewed     Please advise

## 2019-03-30 ENCOUNTER — Ambulatory Visit: Payer: Managed Care, Other (non HMO) | Admitting: Cardiology

## 2019-04-02 ENCOUNTER — Ambulatory Visit: Payer: Managed Care, Other (non HMO) | Admitting: Cardiology

## 2019-04-02 NOTE — Telephone Encounter (Signed)
Per DPR detailed message left for pt to stop BP meds as her BP has improved per Dr. Julien Nordmann review.

## 2019-09-27 ENCOUNTER — Telehealth: Payer: Self-pay | Admitting: Cardiology

## 2019-09-27 NOTE — Telephone Encounter (Signed)
Pt has an appt. 09/30/19. Pt verbalized understanding to go to the ED for any recurrent symptoms. No further questions.

## 2019-09-27 NOTE — Telephone Encounter (Signed)
Called patient. She reports that on Friday evening she began having central chest paint that did not radiate. It was very severe and she went the the emergency room. She also explained that during the pain she was sweaty, nauseas and light headed. After going he ED she left while waiting for a room because she had been there for a really long time. Her initial ekg's from ems were told to be fine. She is not having any of the same symptoms now but just a headache.She wants to know if she should follow up with Dr. Geraldo Pitter and if so how soon. Will consult with him.

## 2019-09-27 NOTE — Telephone Encounter (Signed)
Next available appt. And to go to ED for recurrence is she feels so

## 2019-09-27 NOTE — Telephone Encounter (Signed)
Pt c/o of Chest Pain: STAT if CP now or developed within 24 hours  1. Are you having CP right now? No   2. Are you experiencing any other symptoms (ex. SOB, nausea, vomiting, sweating)? Sweating & Cold chills 3. How long have you been experiencing CP? Occurred Friday, 09/24/19  4. Is your CP continuous or coming and going?  Continuous   5. Have you taken Nitroglycerin? No    [name] is calling stating Friday evening she excperencied the worst CP of her life. She states she was drinking a strawberry daiquari at a friends with her husband and it occurred a little while after finishing it. [name] states the pain was as severe as having a baby and she experienced symptoms of sweating, sweating palms, and cold chills. She states she asked her husband to take her home when this occurred and chewed up two baby aspirins. After getting home the pain had not stopped so 911 was called. Prior to the EMT arrival she was advised to take two additional baby aspirin. Once the EMT's were there they performed an EKG and stated it appeared to come back normal. She was still taken to the hospital due to still having the severe CP. By the time she was seen by triage her pain was at a level 6, they performed another EKG and labs and did not find anything from what she is aware. She was then sent back to the waiting room and left after waiting for hours and the pain stopped due to pt's coming in with Covid. [name] is requesting the EKG results and labs be looked over that were performed at [facility] and a nurse call her to discuss. Please advise.

## 2019-09-30 ENCOUNTER — Ambulatory Visit: Payer: Managed Care, Other (non HMO) | Admitting: Cardiology

## 2019-09-30 ENCOUNTER — Other Ambulatory Visit: Payer: Self-pay

## 2019-09-30 ENCOUNTER — Encounter: Payer: Self-pay | Admitting: Cardiology

## 2019-09-30 VITALS — BP 126/72 | HR 80 | Ht 62.0 in | Wt 158.2 lb

## 2019-09-30 DIAGNOSIS — R072 Precordial pain: Secondary | ICD-10-CM | POA: Diagnosis not present

## 2019-09-30 DIAGNOSIS — R0789 Other chest pain: Secondary | ICD-10-CM

## 2019-09-30 DIAGNOSIS — I10 Essential (primary) hypertension: Secondary | ICD-10-CM

## 2019-09-30 DIAGNOSIS — E782 Mixed hyperlipidemia: Secondary | ICD-10-CM | POA: Diagnosis not present

## 2019-09-30 HISTORY — DX: Other chest pain: R07.89

## 2019-09-30 MED ORDER — NITROGLYCERIN 0.4 MG SL SUBL: 0.4000 mg | SUBLINGUAL_TABLET | SUBLINGUAL | 6 refills | Status: DC | PRN
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
## 2019-09-30 NOTE — Addendum Note (Signed)
Addended by: Truddie Hidden on: 09/30/2019 03:45 PM   Modules accepted: Orders

## 2019-09-30 NOTE — Patient Instructions (Signed)
Medication Instructions:  Your physician has recommended you make the following change in your medication:   Take nitroglycerin as needed for chest pain.  *If you need a refill on your cardiac medications before your next appointment, please call your pharmacy*   Lab Work: None ordered If you have labs (blood work) drawn today and your tests are completely normal, you will receive your results only by: Marland Kitchen MyChart Message (if you have MyChart) OR . A paper copy in the mail If you have any lab test that is abnormal or we need to change your treatment, we will call you to review the results.   Testing/Procedures: .Your physician has requested that you have a lexiscan myoview. For further information please visit [email or web address]. Please follow instruction sheet, as given.  The test will take approximately 3 to 4 hours to complete; you may bring reading material.  If someone comes with you to your appointment, they will need to remain in the main lobby due to limited space in the testing area. **If you are pregnant or breastfeeding, please notify the nuclear lab prior to your appointment**  How to prepare for your Myocardial Perfusion Test: . Do not eat or drink 3 hours prior to your test, except you may have water. . Do not consume products containing caffeine (regular or decaffeinated) 12 hours prior to your test. (ex: coffee, chocolate, sodas, tea). . Do bring a list of your current medications with you.  If not listed below, you may take your medications as normal. . Do wear comfortable clothes (no dresses or overalls) and walking shoes, tennis shoes preferred (No heels or open toe shoes are allowed). . Do NOT wear cologne, perfume, aftershave, or lotions (deodorant is allowed). . If these instructions are not followed, your test will have to be rescheduled.    Follow-Up: At [facility], you and your health needs are our priority.  As part of our continuing mission to provide you  with exceptional heart care, we have created designated Provider Care Teams.  These Care Teams include your primary Cardiologist (physician) and Advanced Practice Providers (APPs -  Physician Assistants and Nurse Practitioners) who all work together to provide you with the care you need, when you need it.  We recommend signing up for the patient portal called "MyChart".  Sign up information is provided on this After Visit Summary.  MyChart is used to connect with patients for Virtual Visits (Telemedicine).  Patients are able to view lab/test results, encounter notes, upcoming appointments, etc.  Non-urgent messages can be sent to your provider as well.   To learn more about what you can do with MyChart, go to [email or web address].    Your next appointment:   3 month(s)  The format for your next appointment:   In Person  Provider:   Jyl Heinz, MD   Other Instructions Nitroglycerin sublingual tablets What is this medicine? NITROGLYCERIN (nye troe GLI ser in) is a type of vasodilator. It relaxes blood vessels, increasing the blood and oxygen supply to your heart. This medicine is used to relieve chest pain caused by angina. It is also used to prevent chest pain before activities like climbing stairs, going outdoors in cold weather, or sexual activity. This medicine may be used for other purposes; ask your health care provider or pharmacist if you have questions. COMMON BRAND NAME(S): Nitroquick, Nitrostat, Nitrotab What should I tell my health care provider before I take this medicine? They need to know if you have any of these  conditions:  anemia  head injury, recent stroke, or bleeding in the brain  liver disease  previous heart attack  an unusual or allergic reaction to nitroglycerin, other medicines, foods, dyes, or preservatives  pregnant or trying to get pregnant  breast-feeding How should I use this medicine? Take this medicine by mouth as needed. At the first sign  of an angina attack (chest pain or tightness) place one tablet under your tongue. You can also take this medicine 5 to 10 minutes before an event likely to produce chest pain. Follow the directions on the prescription label. Let the tablet dissolve under the tongue. Do not swallow whole. Replace the dose if you accidentally swallow it. It will help if your mouth is not dry. Saliva around the tablet will help it to dissolve more quickly. Do not eat or drink, smoke or chew tobacco while a tablet is dissolving. If you are not better within 5 minutes after taking ONE dose of nitroglycerin, call 9-1-1 immediately to seek emergency medical care. Do not take more than 3 nitroglycerin tablets over 15 minutes. If you take this medicine often to relieve symptoms of angina, your doctor or health care professional may provide you with different instructions to manage your symptoms. If symptoms do not go away after following these instructions, it is important to call 9-1-1 immediately. Do not take more than 3 nitroglycerin tablets over 15 minutes. Talk to your pediatrician regarding the use of this medicine in children. Special care may be needed. Overdosage: If you think you have taken too much of this medicine contact a poison control center or emergency room at once. NOTE: This medicine is only for you. Do not share this medicine with others. What if I miss a dose? This does not apply. This medicine is only used as needed. What may interact with this medicine? Do not take this medicine with any of the following medications:  certain migraine medicines like ergotamine and dihydroergotamine (DHE)  medicines used to treat erectile dysfunction like sildenafil, tadalafil, and vardenafil  riociguat This medicine may also interact with the following medications:  alteplase  aspirin  heparin  medicines for high blood pressure  medicines for mental depression  other medicines used to treat  angina  phenothiazines like chlorpromazine, mesoridazine, prochlorperazine, thioridazine This list may not describe all possible interactions. Give your health care provider a list of all the medicines, herbs, non-prescription drugs, or dietary supplements you use. Also tell them if you smoke, drink alcohol, or use illegal drugs. Some items may interact with your medicine. What should I watch for while using this medicine? Tell your doctor or health care professional if you feel your medicine is no longer working. Keep this medicine with you at all times. Sit or lie down when you take your medicine to prevent falling if you feel dizzy or faint after using it. Try to remain calm. This will help you to feel better faster. If you feel dizzy, take several deep breaths and lie down with your feet propped up, or bend forward with your head resting between your knees. You may get drowsy or dizzy. Do not drive, use machinery, or do anything that needs mental alertness until you know how this drug affects you. Do not stand or sit up quickly, especially if you are an older patient. This reduces the risk of dizzy or fainting spells. Alcohol can make you more drowsy and dizzy. Avoid alcoholic drinks. Do not treat yourself for coughs, colds, or pain while  you are taking this medicine without asking your doctor or health care professional for advice. Some ingredients may increase your blood pressure. What side effects may I notice from receiving this medicine? Side effects that you should report to your doctor or health care professional as soon as possible:  blurred vision  dry mouth  skin rash  sweating  the feeling of extreme pressure in the head  unusually weak or tired Side effects that usually do not require medical attention (report to your doctor or health care professional if they continue or are bothersome):  flushing of the face or neck  headache  irregular heartbeat,  palpitations  nausea, vomiting This list may not describe all possible side effects. Call your doctor for medical advice about side effects. You may report side effects to FDA at 1-800-FDA-1088. Where should I keep my medicine? Keep out of the reach of children. Store at room temperature between 20 and 25 degrees C (68 and 77 degrees F). Store in Chief of Staff. Protect from light and moisture. Keep tightly closed. Throw away any unused medicine after the expiration date. NOTE: This sheet is a summary. It may not cover all possible information. If you have questions about this medicine, talk to your doctor, pharmacist, or health care provider.  2020 Elsevier/Gold Standard (2012-12-03 17:57:36)

## 2019-10-05 ENCOUNTER — Telehealth (HOSPITAL_COMMUNITY): Payer: Self-pay | Admitting: *Deleted

## 2019-10-05 ENCOUNTER — Telehealth: Payer: Self-pay | Admitting: Cardiology

## 2019-10-05 NOTE — Telephone Encounter (Signed)
Pt c/o of Chest Pain: STAT if CP now or developed within 24 hours  1. Are you having CP right now? No   2. Are you experiencing any other symptoms (ex. SOB, nausea, vomiting, sweating)? Her headache started & is still there, pain went down her left arm and then went numb(she states the numbness hasn't happened before), she got hot and then she got sweaty. She states she felt nauseous last night.   3. How long have you been experiencing CP? It went on for about 20-30 mins  4. Is your CP continuous or coming and going? Continuous   5. Have you taken Nitroglycerin? She took 3 - took the 1st at 2:45am, waited 15 mins to take the 2nd, and then took the 3rd around 4am.  ?

## 2019-10-05 NOTE — Telephone Encounter (Signed)
Patient given detailed instructions per Myocardial Perfusion Study Information Sheet for the test on 10/06/19. Patient notified to arrive 15 minutes early and that it is imperative to arrive on time for appointment to keep from having the test rescheduled.  If you need to cancel or reschedule your appointment, please call the office within 24 hours of your appointment. . Patient verbalized understanding. Kirstie Peri

## 2019-10-05 NOTE — Telephone Encounter (Signed)
Called patient. No chest pain at this time. She is still having a headache. Per Dr. Agustin Cree patient advised to go to emergency room if chest pain returned. Moved her stress test up to tomorrow and moved follow up appointment up with Dr. Geraldo Pitter.

## 2019-10-05 NOTE — Telephone Encounter (Signed)
    Pt c/o medication issue:  1. Name of Medication: Tylenol   2. How are you currently taking this medication (dosage and times per day)?   3. Are you having a reaction (difficulty breathing--STAT)?   4. What is your medication issue? Pt would like to ask if she can drink tylenol tonight for her pain. She was forgot to ask when talking to the nurse for her stress test tomorrow.

## 2019-10-05 NOTE — Telephone Encounter (Signed)
Pt called to ask if she could take Aleve tonight for pain. Pt advised that Aleve is okay to take prior to stress test. Pt verbalized understanding and had no additional questions.

## 2019-10-06 ENCOUNTER — Telehealth: Payer: Self-pay | Admitting: Cardiology

## 2019-10-06 ENCOUNTER — Encounter (HOSPITAL_COMMUNITY): Payer: Managed Care, Other (non HMO)

## 2019-10-06 NOTE — Telephone Encounter (Signed)
Follow Up  Patient returning call to [name] about stress test. Transferred to [name].
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
## 2019-10-06 NOTE — Telephone Encounter (Signed)
[name] is calling in regards to her my cardio perfusion that had to be canceled due to [place] not approving it. She is requesting whoever is working on it to call her in regards to it. Please advise.

## 2019-10-06 NOTE — Telephone Encounter (Signed)
Steva Ready from [facility] was hoping to set up a Peer to Peer to get approval for the patient's test. Please call and use reference #[id number]

## 2019-10-07 ENCOUNTER — Telehealth (HOSPITAL_COMMUNITY): Payer: Self-pay | Admitting: *Deleted

## 2019-10-07 NOTE — Telephone Encounter (Signed)
The stress test will need a peer to peer review. Let me know when you would like to complete.

## 2019-10-07 NOTE — Telephone Encounter (Signed)
err

## 2019-10-08 ENCOUNTER — Encounter (HOSPITAL_COMMUNITY): Payer: Managed Care, Other (non HMO)

## 2019-10-11 ENCOUNTER — Ambulatory Visit: Payer: Managed Care, Other (non HMO) | Admitting: Cardiology

## 2019-10-11 ENCOUNTER — Ambulatory Visit (HOSPITAL_COMMUNITY): Payer: Managed Care, Other (non HMO) | Attending: Cardiovascular Disease

## 2019-10-11 ENCOUNTER — Other Ambulatory Visit: Payer: Self-pay

## 2019-10-11 DIAGNOSIS — I10 Essential (primary) hypertension: Secondary | ICD-10-CM | POA: Insufficient documentation

## 2019-10-11 DIAGNOSIS — R072 Precordial pain: Secondary | ICD-10-CM | POA: Insufficient documentation

## 2019-10-11 DIAGNOSIS — Z3183 Encounter for assisted reproductive fertility procedure cycle: Secondary | ICD-10-CM | POA: Insufficient documentation

## 2019-10-11 DIAGNOSIS — E782 Mixed hyperlipidemia: Secondary | ICD-10-CM | POA: Diagnosis present

## 2019-10-11 DIAGNOSIS — R0789 Other chest pain: Secondary | ICD-10-CM | POA: Insufficient documentation

## 2019-10-11 HISTORY — DX: Encounter for assisted reproductive fertility procedure cycle: Z31.83

## 2019-10-11 LAB — MYOCARDIAL PERFUSION IMAGING
LV dias vol: 65 mL (ref 46–106)
LV sys vol: 33 mL
Peak HR: 115 {beats}/min
Rest HR: 71 {beats}/min
SDS: 2
SRS: 0
SSS: 2
TID: 1.02

## 2019-10-11 MED ORDER — TECHNETIUM TC 99M TETROFOSMIN IV KIT
10.1000 | PACK | Freq: Once | INTRAVENOUS | Status: AC | PRN
  Administered 2019-10-11: 10.1 via INTRAVENOUS
  Filled 2019-10-11: qty 11

## 2019-10-11 MED ORDER — TECHNETIUM TC 99M TETROFOSMIN IV KIT
32.8000 | PACK | Freq: Once | INTRAVENOUS | Status: AC | PRN
  Administered 2019-10-11: 32.8 via INTRAVENOUS
  Filled 2019-10-11: qty 33

## 2019-10-11 MED ORDER — REGADENOSON 0.4 MG/5ML IV SOLN
0.4000 mg | Freq: Once | INTRAVENOUS | Status: AC
  Administered 2019-10-11: 0.4 mg via INTRAVENOUS

## 2019-10-12 ENCOUNTER — Telehealth: Payer: Self-pay | Admitting: Cardiology

## 2019-10-12 ENCOUNTER — Encounter: Payer: Self-pay | Admitting: Cardiology

## 2019-10-12 ENCOUNTER — Ambulatory Visit (INDEPENDENT_AMBULATORY_CARE_PROVIDER_SITE_OTHER): Payer: Managed Care, Other (non HMO) | Admitting: Cardiology

## 2019-10-12 VITALS — BP 150/102 | HR 78 | Ht 62.0 in | Wt 160.4 lb

## 2019-10-12 DIAGNOSIS — R0789 Other chest pain: Secondary | ICD-10-CM

## 2019-10-12 DIAGNOSIS — E785 Hyperlipidemia, unspecified: Secondary | ICD-10-CM | POA: Diagnosis not present
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
## 2019-10-12 NOTE — Telephone Encounter (Signed)
Patient would like to get a second opinion about her recent test results from Dr. Gwenlyn Found. She wants to hear what Dr. Gwenlyn Found has to say in regards to any future testing or procedures. She also does not want to be sent to [facility] in case of an emergency. She would rather come to a [facility].   She does not want to change providers per say, but she wants to know more than what Dr. Geraldo Pitter had to say.   Please let the patient know what the office decides

## 2019-10-12 NOTE — Patient Instructions (Signed)

## 2019-10-13 NOTE — Telephone Encounter (Signed)
That would be the right thing to do.whatever helps her.

## 2019-10-13 NOTE — Telephone Encounter (Signed)
Patient would like to see Dr. Gwenlyn Found for a second opinion. Dr. Geraldo Pitter is okay with this. Pt states that she does not necessarily want to do a provider switch. Please advise if I need to do anything additional to help facilitate. Thank You, Truddie Hidden, RN

## 2019-10-13 NOTE — Telephone Encounter (Signed)
VM left for pt that a message has been sent to [place] for appointment with Gwenlyn Found.

## 2019-10-14 NOTE — Telephone Encounter (Signed)
Will forward to dr berry for okay to schedule next available.

## 2019-10-16 NOTE — Telephone Encounter (Signed)
That’s fine with me

## 2019-10-18 IMAGING — DX DG CHEST 2V
2 series · 2 of 2 positions shown · non-contrast
Comparison: 08/09/2007

CLINICAL DATA: Chest and neck pain, shortness of breath

EXAM:
CHEST - 2 VIEW

[chest pa]
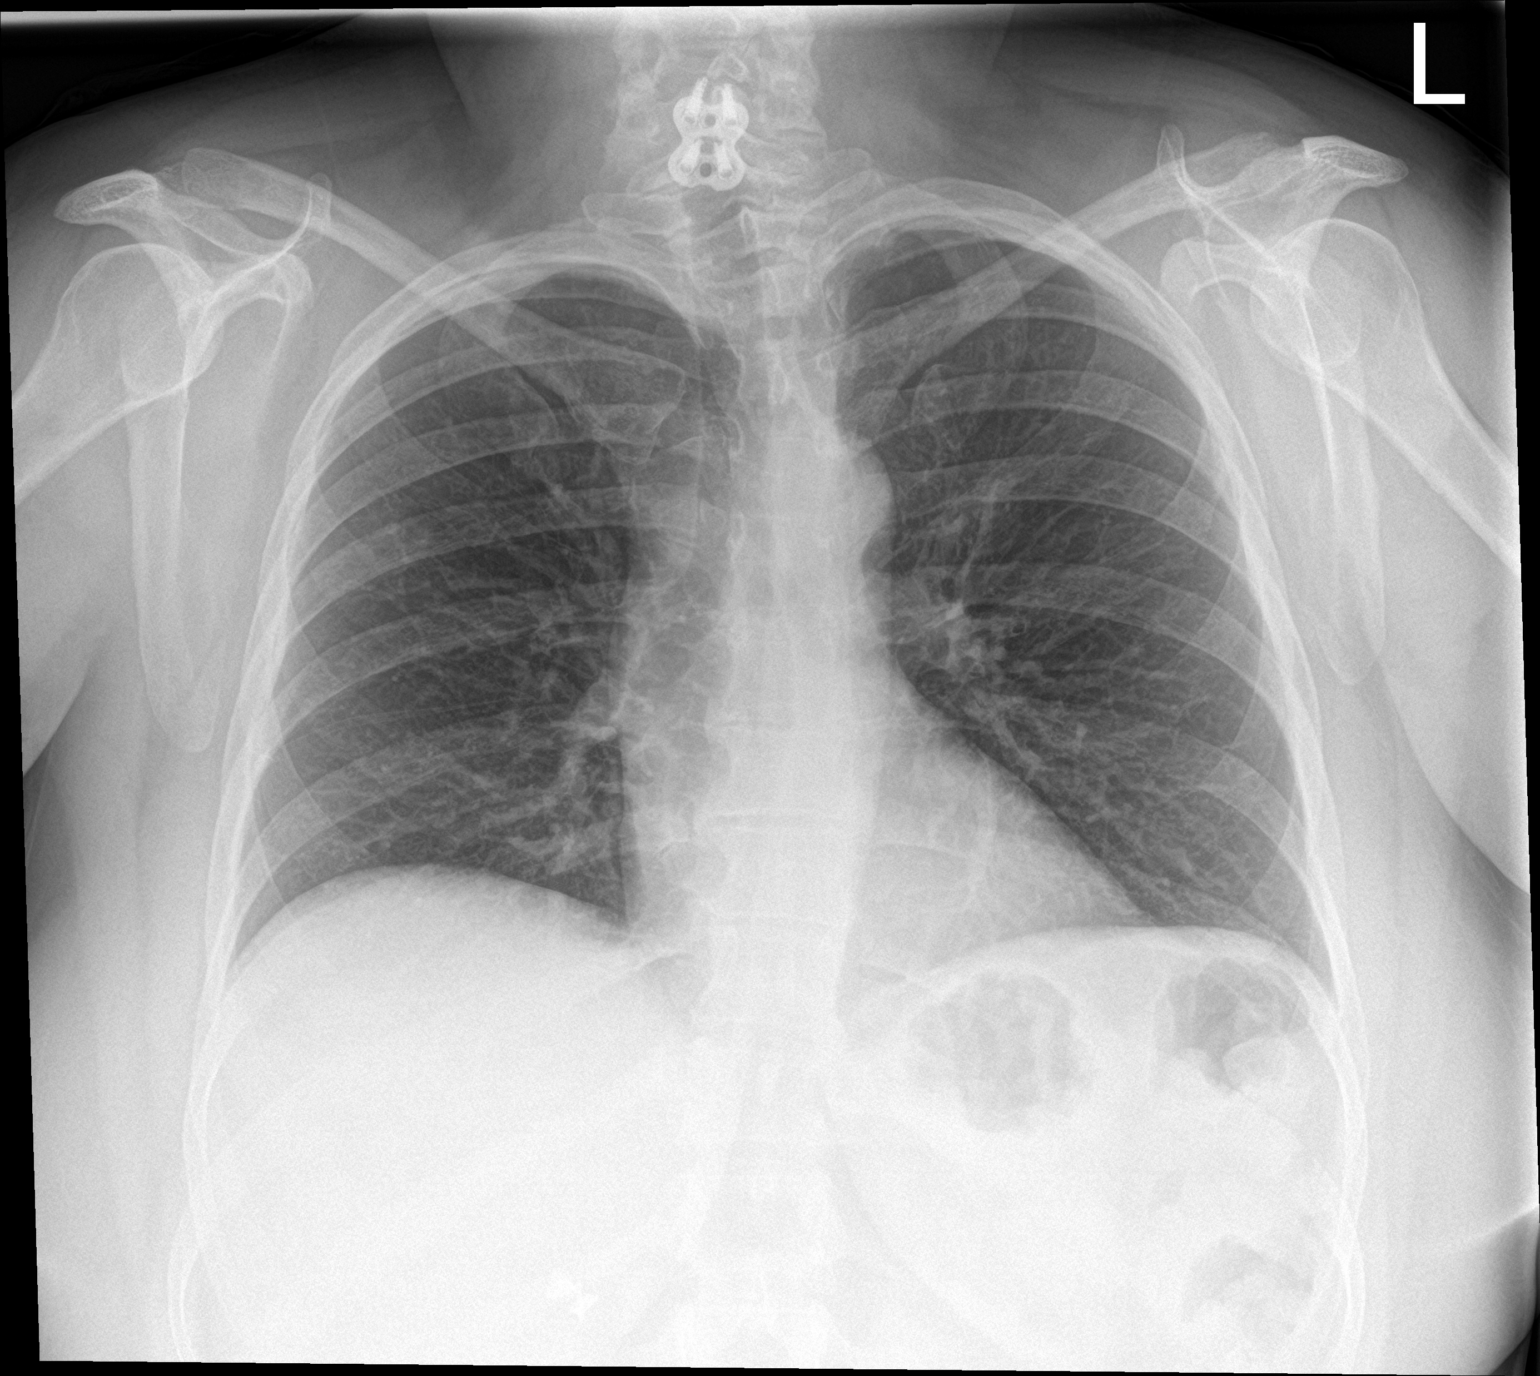

[chest lat]
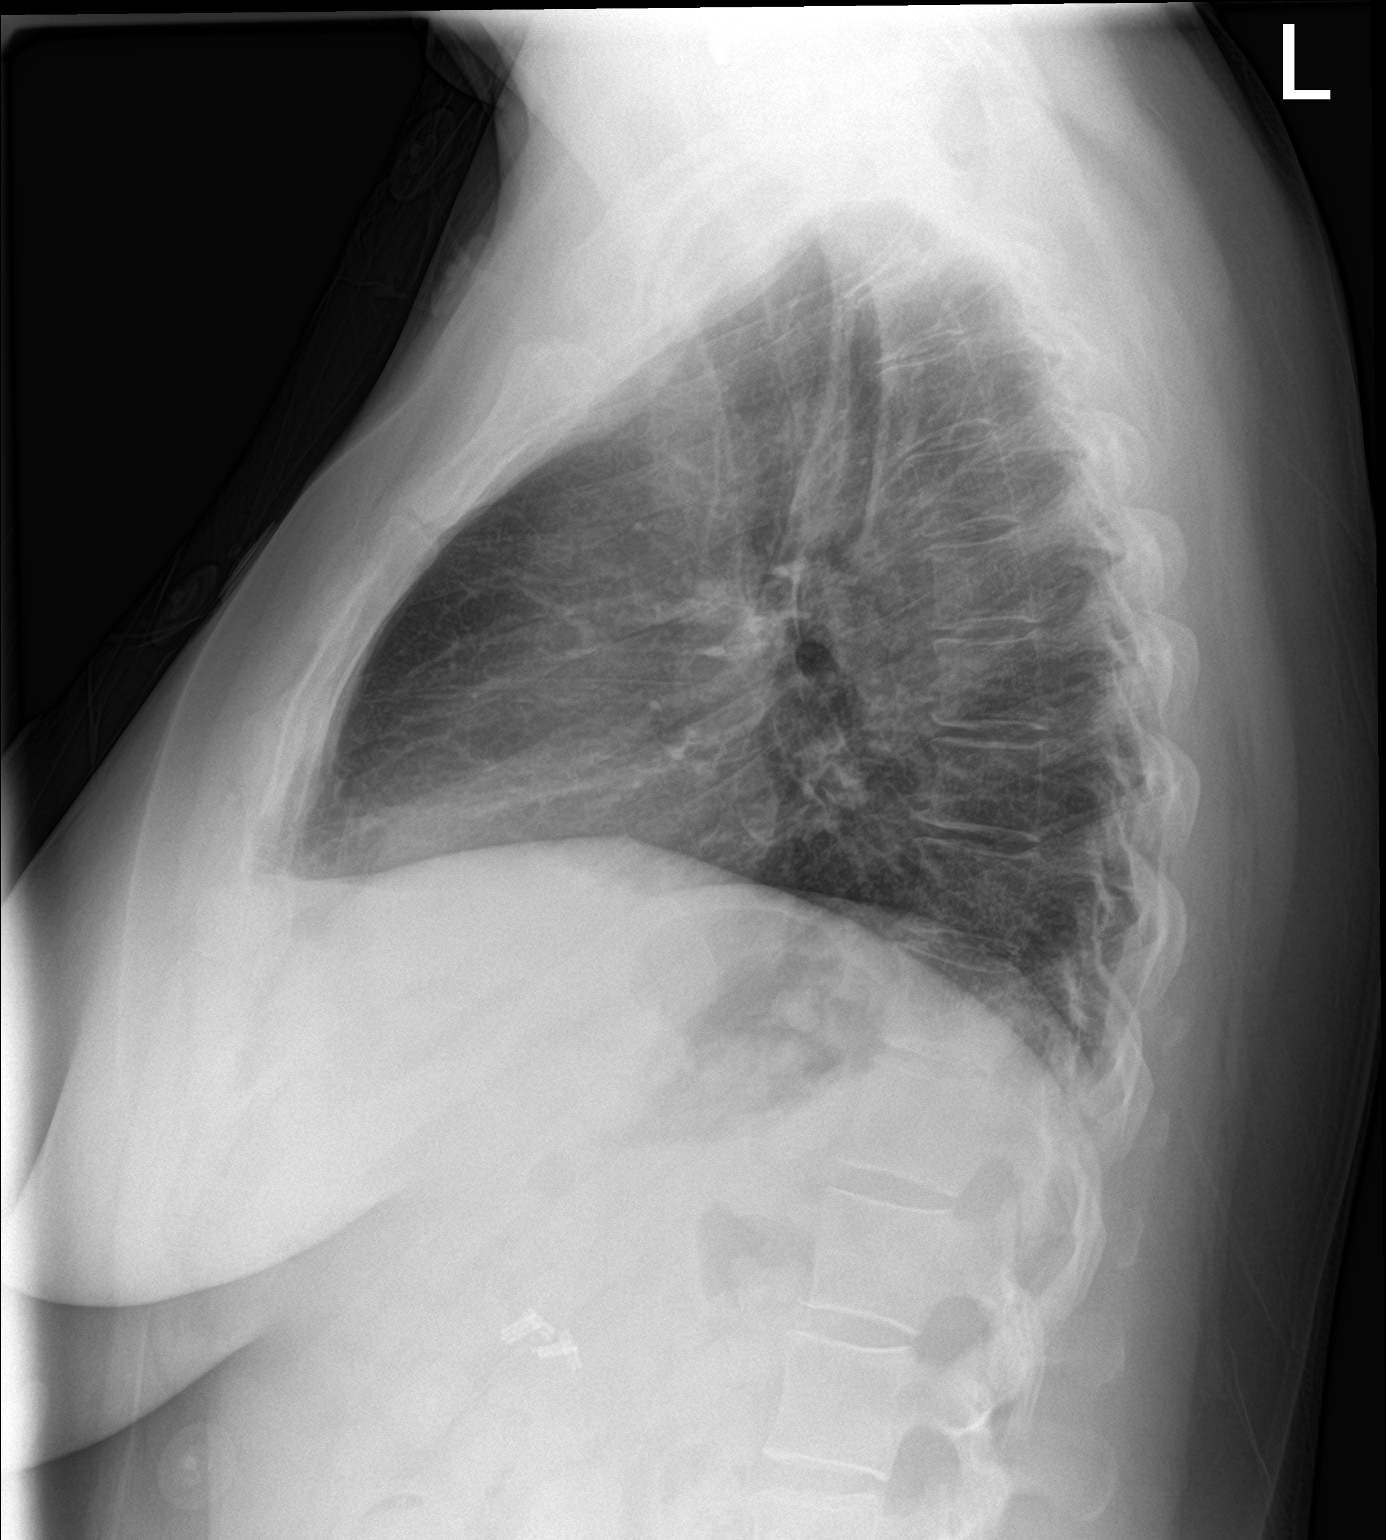

[2 of 2 positions shown; findings below may reference images not displayed]

FINDINGS: Heart and mediastinal contours are within normal limits. No focal
opacities or effusions. No acute bony abnormality.
IMPRESSION: No active cardiopulmonary disease.

## 2019-10-18 NOTE — Telephone Encounter (Signed)
Called patient to schedule appointment and she had to go. She will call back to schedule appointment with dr berry.

## 2019-10-20 ENCOUNTER — Ambulatory Visit: Payer: Managed Care, Other (non HMO) | Admitting: Cardiovascular Disease

## 2019-10-21 ENCOUNTER — Ambulatory Visit: Payer: Managed Care, Other (non HMO) | Admitting: Cardiology

## 2019-12-15 IMAGING — MG MM DIGITAL DIAGNOSTIC UNILAT*L* W/ TOMO W/ CAD
4 series · 4 of 12 positions shown · non-contrast
Comparison: Previous exam(s).

CLINICAL DATA: Screening recall for left breast asymmetry.

EXAM:
DIGITAL DIAGNOSTIC UNILATERAL LEFT MAMMOGRAM WITH CAD AND TOMO

[L CC synth-2D]
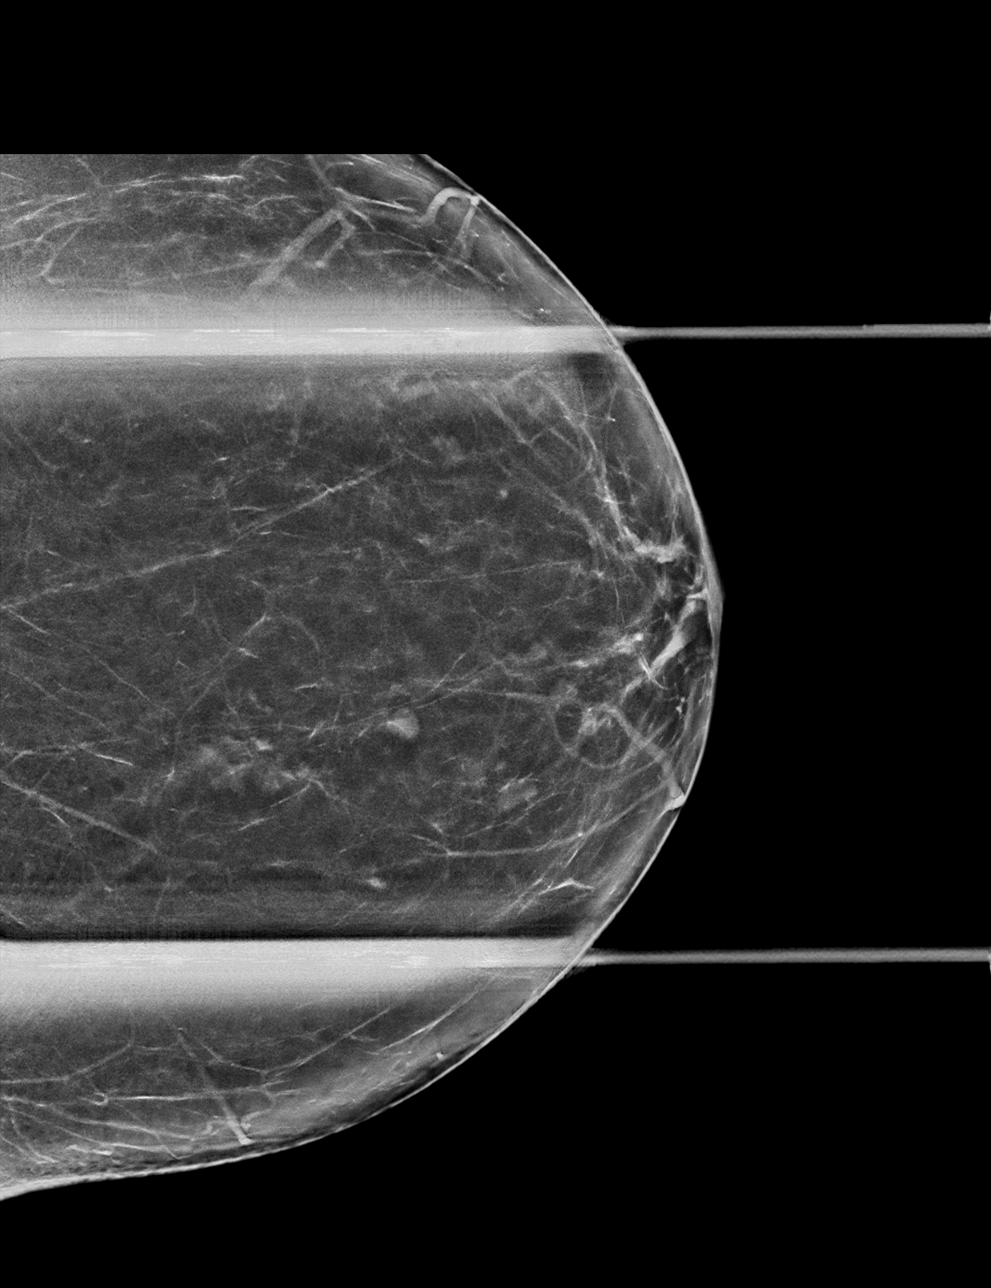

[L MLO synth-2D]
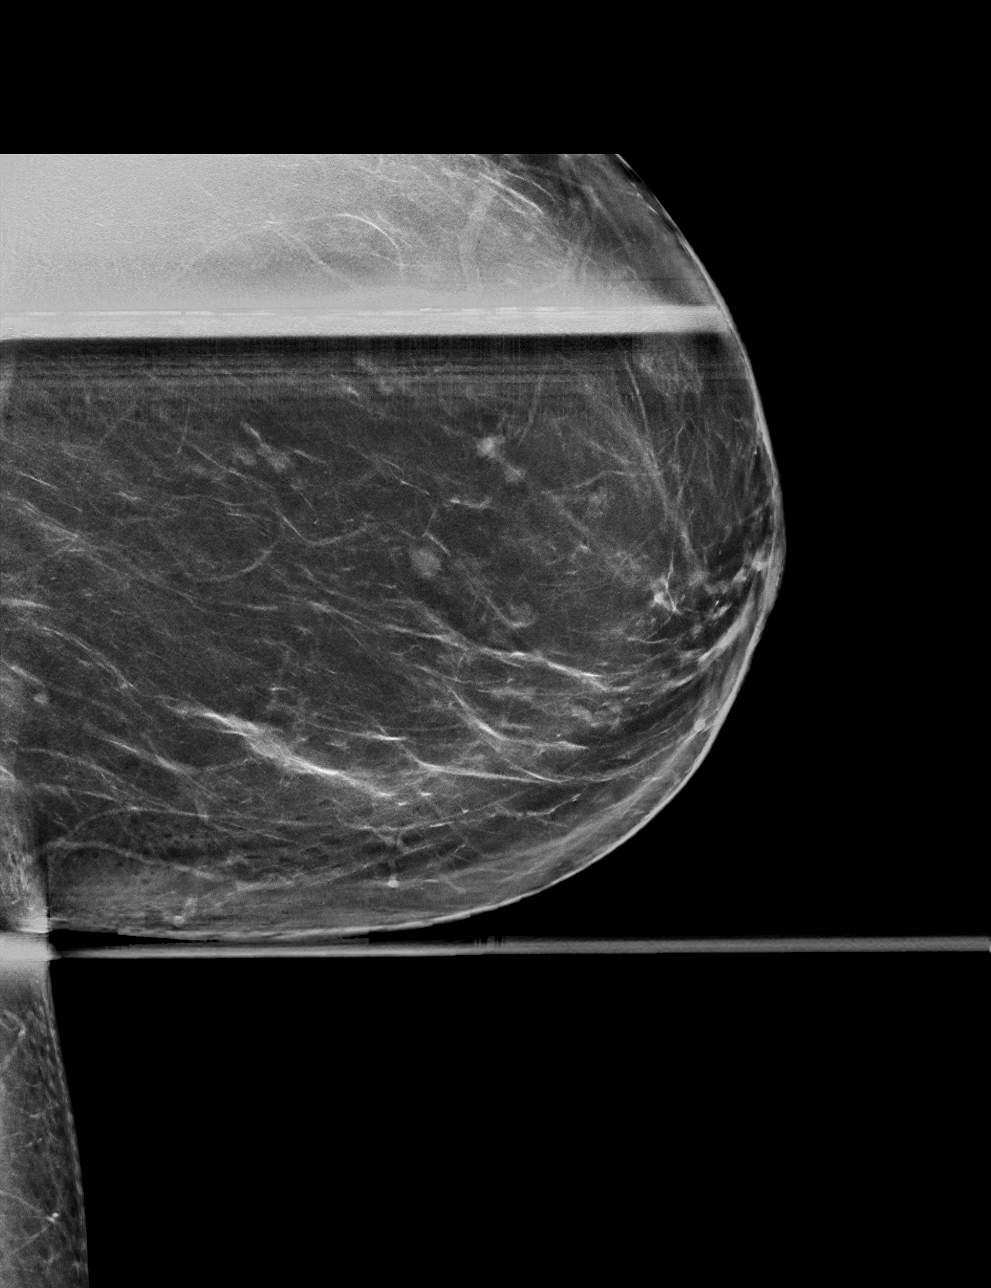

[L MLO tomo · tomo slice 39/78.0]
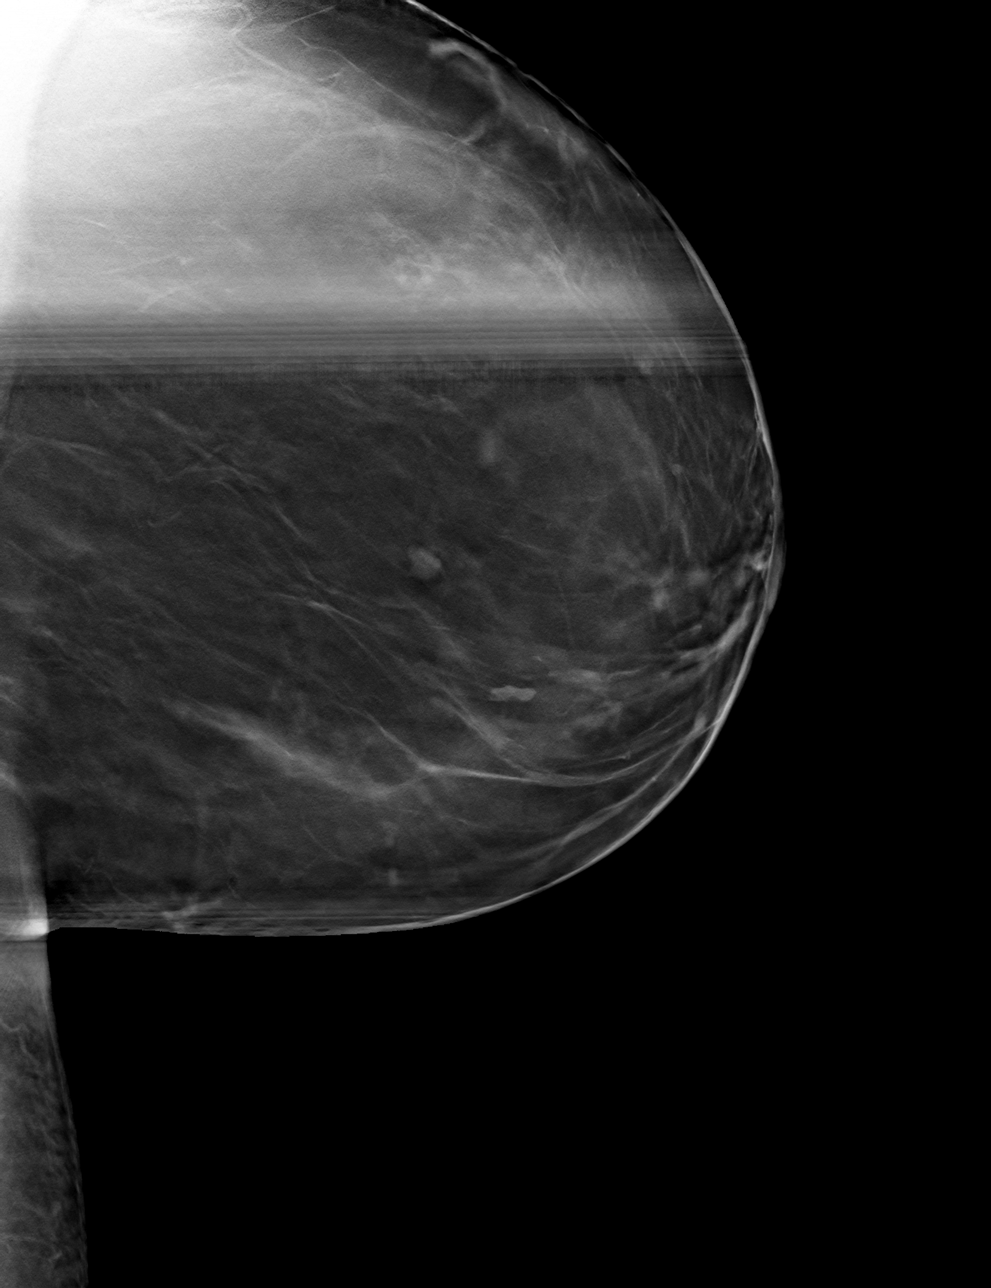

[L CC tomo · tomo slice 35/69.0]
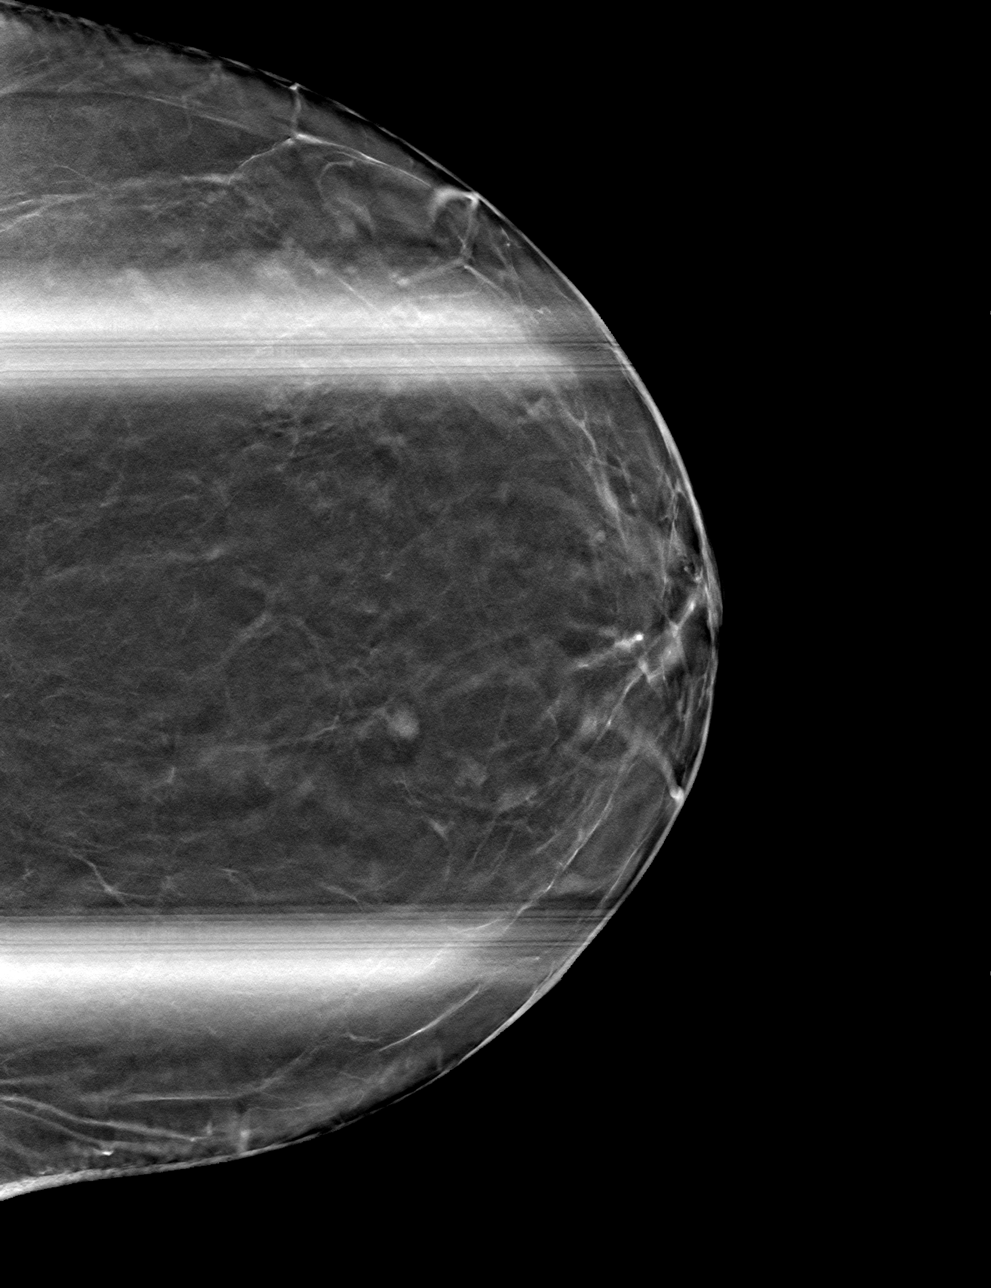

[4 of 12 positions shown; findings below may reference images not displayed]

ACR Breast Density Category b: There are scattered areas of
fibroglandular density.
FINDINGS: Spot compression tomograms were performed of the left breast. The
initially questioned possible left breast asymmetry resolves on the
additional imaging with the fibroglandular pattern unchanged when
compared to the prior exams. There is no mammographic evidence of
malignancy in the left breast.

Mammographic images were processed with CAD.
IMPRESSION: No mammographic evidence of malignancy in the left breast.

RECOMMENDATION:
Screening mammogram in one year.(Code:[id number])

I have discussed the findings and recommendations with the patient.
If applicable, a reminder letter will be sent to the patient
regarding the next appointment.

BI-RADS CATEGORY  1: Negative.

## 2020-01-04 ENCOUNTER — Ambulatory Visit: Payer: Managed Care, Other (non HMO) | Admitting: Cardiology

## 2020-02-24 DIAGNOSIS — F411 Generalized anxiety disorder: Secondary | ICD-10-CM | POA: Diagnosis not present

## 2020-02-24 DIAGNOSIS — F410 Panic disorder [episodic paroxysmal anxiety] without agoraphobia: Secondary | ICD-10-CM | POA: Diagnosis not present

## 2020-03-03 DIAGNOSIS — Z1231 Encounter for screening mammogram for malignant neoplasm of breast: Secondary | ICD-10-CM | POA: Diagnosis not present

## 2020-03-09 DIAGNOSIS — B349 Viral infection, unspecified: Secondary | ICD-10-CM | POA: Diagnosis not present

## 2020-03-09 DIAGNOSIS — J020 Streptococcal pharyngitis: Secondary | ICD-10-CM | POA: Diagnosis not present

## 2020-03-09 DIAGNOSIS — J029 Acute pharyngitis, unspecified: Secondary | ICD-10-CM | POA: Diagnosis not present

## 2020-04-07 DIAGNOSIS — F411 Generalized anxiety disorder: Secondary | ICD-10-CM | POA: Diagnosis not present

## 2020-04-07 DIAGNOSIS — F410 Panic disorder [episodic paroxysmal anxiety] without agoraphobia: Secondary | ICD-10-CM | POA: Diagnosis not present

## 2020-04-17 DIAGNOSIS — Z87442 Personal history of urinary calculi: Secondary | ICD-10-CM | POA: Insufficient documentation

## 2020-04-17 DIAGNOSIS — J309 Allergic rhinitis, unspecified: Secondary | ICD-10-CM | POA: Insufficient documentation

## 2020-04-17 DIAGNOSIS — E559 Vitamin D deficiency, unspecified: Secondary | ICD-10-CM | POA: Insufficient documentation

## 2020-04-17 DIAGNOSIS — R748 Abnormal levels of other serum enzymes: Secondary | ICD-10-CM | POA: Insufficient documentation

## 2020-04-17 DIAGNOSIS — T8859XA Other complications of anesthesia, initial encounter: Secondary | ICD-10-CM | POA: Insufficient documentation

## 2020-04-17 DIAGNOSIS — G4700 Insomnia, unspecified: Secondary | ICD-10-CM | POA: Insufficient documentation

## 2020-04-17 DIAGNOSIS — J302 Other seasonal allergic rhinitis: Secondary | ICD-10-CM | POA: Insufficient documentation

## 2020-04-17 DIAGNOSIS — D649 Anemia, unspecified: Secondary | ICD-10-CM | POA: Insufficient documentation

## 2020-04-17 DIAGNOSIS — M545 Low back pain, unspecified: Secondary | ICD-10-CM | POA: Insufficient documentation

## 2020-04-17 DIAGNOSIS — R112 Nausea with vomiting, unspecified: Secondary | ICD-10-CM | POA: Insufficient documentation

## 2020-04-17 DIAGNOSIS — W3400XA Accidental discharge from unspecified firearms or gun, initial encounter: Secondary | ICD-10-CM | POA: Insufficient documentation

## 2020-04-17 DIAGNOSIS — Z9889 Other specified postprocedural states: Secondary | ICD-10-CM | POA: Insufficient documentation

## 2020-04-17 DIAGNOSIS — K219 Gastro-esophageal reflux disease without esophagitis: Secondary | ICD-10-CM | POA: Insufficient documentation

## 2020-04-18 ENCOUNTER — Ambulatory Visit: Payer: Self-pay | Admitting: Cardiology

## 2020-04-20 DIAGNOSIS — F410 Panic disorder [episodic paroxysmal anxiety] without agoraphobia: Secondary | ICD-10-CM | POA: Diagnosis not present

## 2020-04-20 DIAGNOSIS — F411 Generalized anxiety disorder: Secondary | ICD-10-CM | POA: Diagnosis not present

## 2020-05-08 DIAGNOSIS — F411 Generalized anxiety disorder: Secondary | ICD-10-CM | POA: Diagnosis not present

## 2020-05-08 DIAGNOSIS — F410 Panic disorder [episodic paroxysmal anxiety] without agoraphobia: Secondary | ICD-10-CM | POA: Diagnosis not present

## 2020-05-24 DIAGNOSIS — F410 Panic disorder [episodic paroxysmal anxiety] without agoraphobia: Secondary | ICD-10-CM | POA: Diagnosis not present

## 2020-05-24 DIAGNOSIS — F411 Generalized anxiety disorder: Secondary | ICD-10-CM | POA: Diagnosis not present

## 2020-05-25 DIAGNOSIS — M25562 Pain in left knee: Secondary | ICD-10-CM | POA: Diagnosis not present

## 2020-05-25 DIAGNOSIS — M25561 Pain in right knee: Secondary | ICD-10-CM | POA: Diagnosis not present

## 2020-06-06 DIAGNOSIS — F411 Generalized anxiety disorder: Secondary | ICD-10-CM | POA: Diagnosis not present

## 2020-06-06 DIAGNOSIS — F410 Panic disorder [episodic paroxysmal anxiety] without agoraphobia: Secondary | ICD-10-CM | POA: Diagnosis not present

## 2020-06-27 DIAGNOSIS — F410 Panic disorder [episodic paroxysmal anxiety] without agoraphobia: Secondary | ICD-10-CM | POA: Diagnosis not present

## 2020-06-27 DIAGNOSIS — F411 Generalized anxiety disorder: Secondary | ICD-10-CM | POA: Diagnosis not present

## 2020-07-14 DIAGNOSIS — F410 Panic disorder [episodic paroxysmal anxiety] without agoraphobia: Secondary | ICD-10-CM | POA: Diagnosis not present

## 2020-07-14 DIAGNOSIS — F411 Generalized anxiety disorder: Secondary | ICD-10-CM | POA: Diagnosis not present

## 2020-07-26 DIAGNOSIS — F410 Panic disorder [episodic paroxysmal anxiety] without agoraphobia: Secondary | ICD-10-CM | POA: Diagnosis not present

## 2020-07-26 DIAGNOSIS — F411 Generalized anxiety disorder: Secondary | ICD-10-CM | POA: Diagnosis not present

## 2020-08-03 DIAGNOSIS — F410 Panic disorder [episodic paroxysmal anxiety] without agoraphobia: Secondary | ICD-10-CM | POA: Diagnosis not present

## 2020-08-03 DIAGNOSIS — F411 Generalized anxiety disorder: Secondary | ICD-10-CM | POA: Diagnosis not present

## 2020-08-07 DIAGNOSIS — F410 Panic disorder [episodic paroxysmal anxiety] without agoraphobia: Secondary | ICD-10-CM | POA: Diagnosis not present

## 2020-08-07 DIAGNOSIS — F411 Generalized anxiety disorder: Secondary | ICD-10-CM | POA: Diagnosis not present

## 2020-08-22 DIAGNOSIS — F410 Panic disorder [episodic paroxysmal anxiety] without agoraphobia: Secondary | ICD-10-CM | POA: Diagnosis not present

## 2020-08-22 DIAGNOSIS — F411 Generalized anxiety disorder: Secondary | ICD-10-CM | POA: Diagnosis not present

## 2020-09-05 DIAGNOSIS — F410 Panic disorder [episodic paroxysmal anxiety] without agoraphobia: Secondary | ICD-10-CM | POA: Diagnosis not present

## 2020-09-05 DIAGNOSIS — F411 Generalized anxiety disorder: Secondary | ICD-10-CM | POA: Diagnosis not present

## 2020-09-11 DIAGNOSIS — B349 Viral infection, unspecified: Secondary | ICD-10-CM | POA: Diagnosis not present

## 2020-09-11 DIAGNOSIS — J329 Chronic sinusitis, unspecified: Secondary | ICD-10-CM | POA: Diagnosis not present

## 2020-09-11 DIAGNOSIS — J029 Acute pharyngitis, unspecified: Secondary | ICD-10-CM | POA: Diagnosis not present

## 2020-09-26 DIAGNOSIS — F411 Generalized anxiety disorder: Secondary | ICD-10-CM | POA: Diagnosis not present

## 2020-09-26 DIAGNOSIS — F410 Panic disorder [episodic paroxysmal anxiety] without agoraphobia: Secondary | ICD-10-CM | POA: Diagnosis not present

## 2020-10-11 DIAGNOSIS — F410 Panic disorder [episodic paroxysmal anxiety] without agoraphobia: Secondary | ICD-10-CM | POA: Diagnosis not present

## 2020-10-11 DIAGNOSIS — F411 Generalized anxiety disorder: Secondary | ICD-10-CM | POA: Diagnosis not present

## 2020-10-31 DIAGNOSIS — F410 Panic disorder [episodic paroxysmal anxiety] without agoraphobia: Secondary | ICD-10-CM | POA: Diagnosis not present

## 2020-10-31 DIAGNOSIS — F411 Generalized anxiety disorder: Secondary | ICD-10-CM | POA: Diagnosis not present

## 2020-11-09 ENCOUNTER — Encounter: Payer: Self-pay | Admitting: Neurology

## 2020-11-09 ENCOUNTER — Ambulatory Visit: Payer: BC Managed Care – PPO | Admitting: Neurology

## 2020-11-09 VITALS — BP 144/99 | HR 75 | Ht 63.0 in | Wt 159.0 lb

## 2020-11-09 DIAGNOSIS — R262 Difficulty in walking, not elsewhere classified: Secondary | ICD-10-CM

## 2020-11-09 DIAGNOSIS — R296 Repeated falls: Secondary | ICD-10-CM

## 2020-11-09 DIAGNOSIS — R200 Anesthesia of skin: Secondary | ICD-10-CM

## 2020-11-09 DIAGNOSIS — R208 Other disturbances of skin sensation: Secondary | ICD-10-CM | POA: Diagnosis not present

## 2020-11-09 MED ORDER — TOPIRAMATE 25 MG PO TABS: 25.0000 mg | ORAL_TABLET | Freq: Every day | ORAL | 2 refills | Status: DC
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
## 2020-11-12 LAB — NEUROPATHY PANEL
A/G Ratio: 1.2 (ref 0.7–1.7)
Albumin ELP: 3.7 g/dL (ref 2.9–4.4)
Alpha 1: 0.2 g/dL (ref 0.0–0.4)
Alpha 2: 0.8 g/dL (ref 0.4–1.0)
Angio Convert Enzyme: 29 U/L (ref 14–82)
Anti Nuclear Antibody (ANA): NEGATIVE
Beta: 1.3 g/dL (ref 0.7–1.3)
Gamma Globulin: 0.9 g/dL (ref 0.4–1.8)
Globulin, Total: 3.2 g/dL (ref 2.2–3.9)
Rheumatoid fact SerPl-aCnc: <10 IU/mL (ref <14.0)
Sed Rate: 7 mm/hr (ref 0–40)
TSH: 3.07 u[IU]/mL (ref 0.450–4.500)
Total Protein: 6.9 g/dL (ref 6.0–8.5)
Vit D, 25-Hydroxy: 56.5 ng/mL (ref 30.0–100.0)
Vitamin B-12: 1814 pg/mL — ABNORMAL HIGH (ref 232–1245)

## 2020-11-12 LAB — HEMOGLOBIN A1C
Est. average glucose Bld gHb Est-mCnc: 143 mg/dL
Hgb A1c MFr Bld: 6.6 % — ABNORMAL HIGH (ref 4.8–5.6)

## 2020-11-13 ENCOUNTER — Telehealth: Payer: Self-pay | Admitting: *Deleted

## 2020-11-13 NOTE — Telephone Encounter (Signed)
Left patient a detailed message with results and instructions to follow up w/ PCP on voicemail (ok per DPR).  Provided our number to call back with any questions.

## 2020-11-13 NOTE — Telephone Encounter (Signed)
-----   Message from Garvin Fila, MD sent at 11/10/2020  5:14 PM EDT ----- Mitchell Heir inform the patient that lab work for reversible causes of neuropathy is mostly normal except screening test for diabetes is slightly high and she needs to see primary care physician to discuss appropriate treatment for this ----- Message ----- From: Interface, [facility] Lab Results In Sent: 11/10/2020   7:37 AM EDT To: Garvin Fila, MD

## 2020-11-21 ENCOUNTER — Telehealth: Payer: Self-pay | Admitting: *Deleted

## 2020-11-21 ENCOUNTER — Other Ambulatory Visit: Payer: Self-pay | Admitting: *Deleted

## 2020-11-21 ENCOUNTER — Telehealth: Payer: Self-pay

## 2020-11-21 NOTE — Telephone Encounter (Signed)
I spoke to patient. She verbalized understanding of the lab findings. Reports being on Metformin but has not been taking it regularly. She will start taking it as prescribed. Agreeable to follow up with PCP.

## 2020-11-21 NOTE — Telephone Encounter (Signed)
-----   Message from Garvin Fila, MD sent at 11/21/2020  8:37 AM EDT ----- [name] advised the patient that lab work for reversible causes of neuropathy was all normal except screening test for diabetes shows borderline risk and I recommend she see her primary care physician to discuss possible treatment options for this. ----- Message ----- From: Lavone Neri Lab Results In Sent: 11/10/2020   7:37 AM EDT To: Garvin Fila, MD

## 2020-11-21 NOTE — Telephone Encounter (Signed)
Attempted to call pt, LVM for normal results per DPR. Ask pt to call back for questions or concerns.  

## 2020-11-29 DIAGNOSIS — Z0271 Encounter for disability determination: Secondary | ICD-10-CM

## 2020-11-30 DIAGNOSIS — F410 Panic disorder [episodic paroxysmal anxiety] without agoraphobia: Secondary | ICD-10-CM | POA: Diagnosis not present

## 2020-11-30 DIAGNOSIS — F411 Generalized anxiety disorder: Secondary | ICD-10-CM | POA: Diagnosis not present

## 2020-12-06 DIAGNOSIS — F411 Generalized anxiety disorder: Secondary | ICD-10-CM | POA: Diagnosis not present

## 2020-12-06 DIAGNOSIS — F410 Panic disorder [episodic paroxysmal anxiety] without agoraphobia: Secondary | ICD-10-CM | POA: Diagnosis not present

## 2020-12-20 DIAGNOSIS — F411 Generalized anxiety disorder: Secondary | ICD-10-CM | POA: Diagnosis not present

## 2020-12-20 DIAGNOSIS — F410 Panic disorder [episodic paroxysmal anxiety] without agoraphobia: Secondary | ICD-10-CM | POA: Diagnosis not present

## 2020-12-21 DIAGNOSIS — E559 Vitamin D deficiency, unspecified: Secondary | ICD-10-CM | POA: Diagnosis not present

## 2020-12-21 DIAGNOSIS — E782 Mixed hyperlipidemia: Secondary | ICD-10-CM | POA: Diagnosis not present

## 2020-12-21 DIAGNOSIS — F411 Generalized anxiety disorder: Secondary | ICD-10-CM | POA: Diagnosis not present

## 2020-12-21 DIAGNOSIS — I10 Essential (primary) hypertension: Secondary | ICD-10-CM | POA: Diagnosis not present

## 2020-12-21 DIAGNOSIS — E1169 Type 2 diabetes mellitus with other specified complication: Secondary | ICD-10-CM | POA: Diagnosis not present

## 2020-12-28 DIAGNOSIS — K219 Gastro-esophageal reflux disease without esophagitis: Secondary | ICD-10-CM | POA: Diagnosis not present

## 2020-12-28 DIAGNOSIS — R1310 Dysphagia, unspecified: Secondary | ICD-10-CM | POA: Diagnosis not present

## 2021-01-16 DIAGNOSIS — F411 Generalized anxiety disorder: Secondary | ICD-10-CM | POA: Diagnosis not present

## 2021-01-16 DIAGNOSIS — F410 Panic disorder [episodic paroxysmal anxiety] without agoraphobia: Secondary | ICD-10-CM | POA: Diagnosis not present

## 2021-01-25 DIAGNOSIS — R1310 Dysphagia, unspecified: Secondary | ICD-10-CM | POA: Diagnosis not present

## 2021-01-25 DIAGNOSIS — K317 Polyp of stomach and duodenum: Secondary | ICD-10-CM | POA: Diagnosis not present

## 2021-01-25 DIAGNOSIS — K5730 Diverticulosis of large intestine without perforation or abscess without bleeding: Secondary | ICD-10-CM | POA: Diagnosis not present

## 2021-01-25 DIAGNOSIS — D122 Benign neoplasm of ascending colon: Secondary | ICD-10-CM | POA: Diagnosis not present

## 2021-01-25 DIAGNOSIS — K2930 Chronic superficial gastritis without bleeding: Secondary | ICD-10-CM | POA: Diagnosis not present

## 2021-01-25 DIAGNOSIS — K648 Other hemorrhoids: Secondary | ICD-10-CM | POA: Diagnosis not present

## 2021-01-25 DIAGNOSIS — K219 Gastro-esophageal reflux disease without esophagitis: Secondary | ICD-10-CM | POA: Diagnosis not present

## 2021-01-25 DIAGNOSIS — Z1211 Encounter for screening for malignant neoplasm of colon: Secondary | ICD-10-CM | POA: Diagnosis not present

## 2021-01-25 DIAGNOSIS — Z800 Family history of malignant neoplasm of digestive organs: Secondary | ICD-10-CM | POA: Diagnosis not present

## 2021-01-29 DIAGNOSIS — F410 Panic disorder [episodic paroxysmal anxiety] without agoraphobia: Secondary | ICD-10-CM | POA: Diagnosis not present

## 2021-01-29 DIAGNOSIS — F411 Generalized anxiety disorder: Secondary | ICD-10-CM | POA: Diagnosis not present

## 2021-01-31 ENCOUNTER — Encounter: Payer: BC Managed Care – PPO | Admitting: Neurology

## 2021-01-31 ENCOUNTER — Other Ambulatory Visit: Payer: Self-pay

## 2021-01-31 ENCOUNTER — Telehealth: Payer: Self-pay | Admitting: *Deleted

## 2021-01-31 ENCOUNTER — Ambulatory Visit (INDEPENDENT_AMBULATORY_CARE_PROVIDER_SITE_OTHER): Payer: BC Managed Care – PPO | Admitting: Neurology

## 2021-01-31 DIAGNOSIS — Z0289 Encounter for other administrative examinations: Secondary | ICD-10-CM

## 2021-01-31 DIAGNOSIS — R200 Anesthesia of skin: Secondary | ICD-10-CM

## 2021-01-31 DIAGNOSIS — R296 Repeated falls: Secondary | ICD-10-CM
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
## 2021-01-31 NOTE — Telephone Encounter (Signed)
-----   Message from Garvin Fila, MD sent at 01/31/2021  5:10 PM EST ----- Kindly inform the patient that EMG nerve conduction study showed no significant evidence of nerve or muscle damage in the feet and was normal

## 2021-01-31 NOTE — Progress Notes (Signed)
Kindly inform the patient that EMG nerve conduction study showed no significant evidence of nerve or muscle damage in the feet and was normal

## 2021-01-31 NOTE — Telephone Encounter (Signed)
I spoke to the patient and provided her with the results.

## 2021-02-22 DIAGNOSIS — F410 Panic disorder [episodic paroxysmal anxiety] without agoraphobia: Secondary | ICD-10-CM | POA: Diagnosis not present

## 2021-02-22 DIAGNOSIS — F411 Generalized anxiety disorder: Secondary | ICD-10-CM | POA: Diagnosis not present

## 2021-03-08 DIAGNOSIS — F411 Generalized anxiety disorder: Secondary | ICD-10-CM | POA: Diagnosis not present

## 2021-03-08 DIAGNOSIS — F410 Panic disorder [episodic paroxysmal anxiety] without agoraphobia: Secondary | ICD-10-CM | POA: Diagnosis not present

## 2021-03-14 DIAGNOSIS — Z1231 Encounter for screening mammogram for malignant neoplasm of breast: Secondary | ICD-10-CM | POA: Diagnosis not present

## 2021-03-14 DIAGNOSIS — Z6829 Body mass index (BMI) 29.0-29.9, adult: Secondary | ICD-10-CM | POA: Diagnosis not present

## 2021-03-14 DIAGNOSIS — Z01419 Encounter for gynecological examination (general) (routine) without abnormal findings: Secondary | ICD-10-CM | POA: Diagnosis not present

## 2021-03-22 DIAGNOSIS — F410 Panic disorder [episodic paroxysmal anxiety] without agoraphobia: Secondary | ICD-10-CM | POA: Diagnosis not present

## 2021-03-22 DIAGNOSIS — F411 Generalized anxiety disorder: Secondary | ICD-10-CM | POA: Diagnosis not present

## 2021-03-28 ENCOUNTER — Ambulatory Visit: Payer: BC Managed Care – PPO | Admitting: Neurology

## 2021-04-11 DIAGNOSIS — E1169 Type 2 diabetes mellitus with other specified complication: Secondary | ICD-10-CM | POA: Diagnosis not present

## 2021-04-11 DIAGNOSIS — I10 Essential (primary) hypertension: Secondary | ICD-10-CM | POA: Diagnosis not present

## 2021-04-11 DIAGNOSIS — F411 Generalized anxiety disorder: Secondary | ICD-10-CM | POA: Diagnosis not present

## 2021-04-11 DIAGNOSIS — E782 Mixed hyperlipidemia: Secondary | ICD-10-CM | POA: Diagnosis not present

## 2021-04-13 DIAGNOSIS — F411 Generalized anxiety disorder: Secondary | ICD-10-CM | POA: Diagnosis not present

## 2021-04-13 DIAGNOSIS — F410 Panic disorder [episodic paroxysmal anxiety] without agoraphobia: Secondary | ICD-10-CM | POA: Diagnosis not present

## 2021-04-16 DIAGNOSIS — F410 Panic disorder [episodic paroxysmal anxiety] without agoraphobia: Secondary | ICD-10-CM | POA: Diagnosis not present

## 2021-04-16 DIAGNOSIS — Z79891 Long term (current) use of opiate analgesic: Secondary | ICD-10-CM | POA: Diagnosis not present

## 2021-04-16 DIAGNOSIS — F411 Generalized anxiety disorder: Secondary | ICD-10-CM | POA: Diagnosis not present

## 2021-04-25 DIAGNOSIS — E119 Type 2 diabetes mellitus without complications: Secondary | ICD-10-CM | POA: Diagnosis not present

## 2021-04-25 DIAGNOSIS — F419 Anxiety disorder, unspecified: Secondary | ICD-10-CM | POA: Diagnosis not present

## 2021-04-25 DIAGNOSIS — R5382 Chronic fatigue, unspecified: Secondary | ICD-10-CM | POA: Diagnosis not present

## 2021-04-25 DIAGNOSIS — E782 Mixed hyperlipidemia: Secondary | ICD-10-CM | POA: Diagnosis not present

## 2021-04-27 DIAGNOSIS — F411 Generalized anxiety disorder: Secondary | ICD-10-CM | POA: Diagnosis not present

## 2021-04-27 DIAGNOSIS — F410 Panic disorder [episodic paroxysmal anxiety] without agoraphobia: Secondary | ICD-10-CM | POA: Diagnosis not present

## 2021-05-17 DIAGNOSIS — F410 Panic disorder [episodic paroxysmal anxiety] without agoraphobia: Secondary | ICD-10-CM | POA: Diagnosis not present

## 2021-05-17 DIAGNOSIS — F411 Generalized anxiety disorder: Secondary | ICD-10-CM | POA: Diagnosis not present

## 2021-06-04 DIAGNOSIS — R5382 Chronic fatigue, unspecified: Secondary | ICD-10-CM | POA: Diagnosis not present

## 2021-06-04 DIAGNOSIS — K219 Gastro-esophageal reflux disease without esophagitis: Secondary | ICD-10-CM | POA: Diagnosis not present

## 2021-06-13 DIAGNOSIS — F411 Generalized anxiety disorder: Secondary | ICD-10-CM | POA: Diagnosis not present

## 2021-06-13 DIAGNOSIS — F410 Panic disorder [episodic paroxysmal anxiety] without agoraphobia: Secondary | ICD-10-CM | POA: Diagnosis not present

## 2021-06-26 DIAGNOSIS — F411 Generalized anxiety disorder: Secondary | ICD-10-CM | POA: Diagnosis not present

## 2021-06-26 DIAGNOSIS — F410 Panic disorder [episodic paroxysmal anxiety] without agoraphobia: Secondary | ICD-10-CM | POA: Diagnosis not present

## 2021-07-10 DIAGNOSIS — E119 Type 2 diabetes mellitus without complications: Secondary | ICD-10-CM | POA: Diagnosis not present

## 2021-07-10 DIAGNOSIS — E782 Mixed hyperlipidemia: Secondary | ICD-10-CM | POA: Diagnosis not present

## 2021-07-10 DIAGNOSIS — F419 Anxiety disorder, unspecified: Secondary | ICD-10-CM | POA: Diagnosis not present

## 2021-07-10 DIAGNOSIS — D518 Other vitamin B12 deficiency anemias: Secondary | ICD-10-CM | POA: Diagnosis not present

## 2021-07-10 DIAGNOSIS — I10 Essential (primary) hypertension: Secondary | ICD-10-CM | POA: Diagnosis not present

## 2021-07-10 DIAGNOSIS — E038 Other specified hypothyroidism: Secondary | ICD-10-CM | POA: Diagnosis not present

## 2021-07-10 DIAGNOSIS — E559 Vitamin D deficiency, unspecified: Secondary | ICD-10-CM | POA: Diagnosis not present

## 2021-07-10 DIAGNOSIS — R5382 Chronic fatigue, unspecified: Secondary | ICD-10-CM | POA: Diagnosis not present

## 2021-07-10 DIAGNOSIS — Z0000 Encounter for general adult medical examination without abnormal findings: Secondary | ICD-10-CM | POA: Diagnosis not present

## 2021-07-17 DIAGNOSIS — F411 Generalized anxiety disorder: Secondary | ICD-10-CM | POA: Diagnosis not present

## 2021-07-17 DIAGNOSIS — F410 Panic disorder [episodic paroxysmal anxiety] without agoraphobia: Secondary | ICD-10-CM | POA: Diagnosis not present

## 2021-07-24 DIAGNOSIS — R531 Weakness: Secondary | ICD-10-CM | POA: Diagnosis not present

## 2021-08-03 DIAGNOSIS — F410 Panic disorder [episodic paroxysmal anxiety] without agoraphobia: Secondary | ICD-10-CM | POA: Diagnosis not present

## 2021-08-03 DIAGNOSIS — F411 Generalized anxiety disorder: Secondary | ICD-10-CM | POA: Diagnosis not present

## 2021-08-13 DIAGNOSIS — I10 Essential (primary) hypertension: Secondary | ICD-10-CM | POA: Diagnosis not present

## 2021-08-13 DIAGNOSIS — E782 Mixed hyperlipidemia: Secondary | ICD-10-CM | POA: Diagnosis not present

## 2021-08-13 DIAGNOSIS — F410 Panic disorder [episodic paroxysmal anxiety] without agoraphobia: Secondary | ICD-10-CM | POA: Diagnosis not present

## 2021-08-13 DIAGNOSIS — R5382 Chronic fatigue, unspecified: Secondary | ICD-10-CM | POA: Diagnosis not present

## 2021-08-13 DIAGNOSIS — F411 Generalized anxiety disorder: Secondary | ICD-10-CM | POA: Diagnosis not present

## 2021-08-24 DIAGNOSIS — F410 Panic disorder [episodic paroxysmal anxiety] without agoraphobia: Secondary | ICD-10-CM | POA: Diagnosis not present

## 2021-08-24 DIAGNOSIS — F411 Generalized anxiety disorder: Secondary | ICD-10-CM | POA: Diagnosis not present

## 2021-09-11 ENCOUNTER — Telehealth: Payer: Self-pay | Admitting: Cardiology

## 2021-09-11 DIAGNOSIS — R0789 Other chest pain: Secondary | ICD-10-CM

## 2021-09-11 MED ORDER — NITROGLYCERIN 0.4 MG SL SUBL: 0.4000 mg | SUBLINGUAL_TABLET | SUBLINGUAL | 6 refills | Status: DC | PRN
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
## 2021-09-11 NOTE — Telephone Encounter (Signed)
Pt c/o of Chest Pain: STAT if CP now or developed within 24 hours  1. Are you having CP right now? no  2. Are you experiencing any other symptoms (ex. SOB, nausea, vomiting, sweating)? no  3. How long have you been experiencing CP? Only lasted about an hour last night   4. Is your CP continuous or coming and going? Came and went   5. Have you taken Nitroglycerin? No. Patient did take an aspirin  Patient said pain radiated down her arm and leg. She laid down and put a heating pad on her leg  ?

## 2021-09-13 DIAGNOSIS — F411 Generalized anxiety disorder: Secondary | ICD-10-CM | POA: Diagnosis not present

## 2021-09-13 DIAGNOSIS — F410 Panic disorder [episodic paroxysmal anxiety] without agoraphobia: Secondary | ICD-10-CM | POA: Diagnosis not present

## 2021-09-18 DIAGNOSIS — E782 Mixed hyperlipidemia: Secondary | ICD-10-CM | POA: Diagnosis not present

## 2021-09-18 DIAGNOSIS — R079 Chest pain, unspecified: Secondary | ICD-10-CM | POA: Diagnosis not present

## 2021-09-18 DIAGNOSIS — R5383 Other fatigue: Secondary | ICD-10-CM | POA: Diagnosis not present

## 2021-09-18 DIAGNOSIS — E119 Type 2 diabetes mellitus without complications: Secondary | ICD-10-CM | POA: Diagnosis not present

## 2021-09-18 DIAGNOSIS — I10 Essential (primary) hypertension: Secondary | ICD-10-CM | POA: Diagnosis not present

## 2021-09-19 ENCOUNTER — Other Ambulatory Visit: Payer: Self-pay

## 2021-09-19 DIAGNOSIS — D518 Other vitamin B12 deficiency anemias: Secondary | ICD-10-CM

## 2021-09-19 DIAGNOSIS — E039 Hypothyroidism, unspecified: Secondary | ICD-10-CM

## 2021-09-19 DIAGNOSIS — J019 Acute sinusitis, unspecified: Secondary | ICD-10-CM

## 2021-09-19 DIAGNOSIS — R7303 Prediabetes: Secondary | ICD-10-CM

## 2021-09-19 DIAGNOSIS — K5900 Constipation, unspecified: Secondary | ICD-10-CM

## 2021-09-19 DIAGNOSIS — I10 Essential (primary) hypertension: Secondary | ICD-10-CM

## 2021-09-19 DIAGNOSIS — M5432 Sciatica, left side: Secondary | ICD-10-CM

## 2021-09-19 DIAGNOSIS — R300 Dysuria: Secondary | ICD-10-CM

## 2021-09-19 DIAGNOSIS — E782 Mixed hyperlipidemia: Secondary | ICD-10-CM

## 2021-09-19 DIAGNOSIS — H814 Vertigo of central origin: Secondary | ICD-10-CM

## 2021-09-19 DIAGNOSIS — Z79899 Other long term (current) drug therapy: Secondary | ICD-10-CM

## 2021-09-19 DIAGNOSIS — E119 Type 2 diabetes mellitus without complications: Secondary | ICD-10-CM

## 2021-09-19 DIAGNOSIS — Z0000 Encounter for general adult medical examination without abnormal findings: Secondary | ICD-10-CM

## 2021-09-19 DIAGNOSIS — R5382 Chronic fatigue, unspecified: Secondary | ICD-10-CM

## 2021-09-19 HISTORY — DX: Mixed hyperlipidemia: E78.2

## 2021-09-19 HISTORY — DX: Prediabetes: R73.03

## 2021-09-19 HISTORY — DX: Type 2 diabetes mellitus without complications: E11.9

## 2021-09-19 HISTORY — DX: Vertigo of central origin: H81.4

## 2021-09-19 HISTORY — DX: Chronic fatigue, unspecified: R53.82

## 2021-09-19 HISTORY — DX: Acute sinusitis, unspecified: J01.90

## 2021-09-19 HISTORY — DX: Encounter for general adult medical examination without abnormal findings: Z00.00

## 2021-09-19 HISTORY — DX: Hypothyroidism, unspecified: E03.9

## 2021-09-19 HISTORY — DX: Other vitamin B12 deficiency anemias: D51.8

## 2021-09-19 HISTORY — DX: Essential (primary) hypertension: I10

## 2021-09-19 HISTORY — DX: Other long term (current) drug therapy: Z79.899

## 2021-09-19 HISTORY — DX: Dysuria: R30.0

## 2021-09-19 HISTORY — DX: Sciatica, left side: M54.32

## 2021-09-19 HISTORY — DX: Constipation, unspecified: K59.00

## 2021-09-20 ENCOUNTER — Encounter: Payer: Self-pay | Admitting: Cardiology

## 2021-09-20 ENCOUNTER — Ambulatory Visit: Payer: BC Managed Care – PPO | Admitting: Cardiology

## 2021-09-20 VITALS — BP 130/84 | HR 97 | Ht 62.6 in | Wt 151.2 lb

## 2021-09-20 DIAGNOSIS — E782 Mixed hyperlipidemia: Secondary | ICD-10-CM | POA: Diagnosis not present

## 2021-09-20 DIAGNOSIS — I10 Essential (primary) hypertension: Secondary | ICD-10-CM

## 2021-09-20 DIAGNOSIS — I209 Angina pectoris, unspecified: Secondary | ICD-10-CM | POA: Diagnosis not present

## 2021-09-20 DIAGNOSIS — E119 Type 2 diabetes mellitus without complications: Secondary | ICD-10-CM

## 2021-09-20 DIAGNOSIS — I259 Chronic ischemic heart disease, unspecified: Secondary | ICD-10-CM

## 2021-09-20 HISTORY — DX: Type 2 diabetes mellitus without complications: E11.9

## 2021-09-20 HISTORY — DX: Angina pectoris, unspecified: I20.9

## 2021-09-20 MED ORDER — METOPROLOL TARTRATE 100 MG PO TABS: 100.0000 mg | ORAL_TABLET | Freq: Once | ORAL | 0 refills | Status: DC

## 2021-09-20 MED ORDER — NITROGLYCERIN 0.4 MG SL SUBL: 0.4000 mg | SUBLINGUAL_TABLET | SUBLINGUAL | 6 refills | Status: AC | PRN

## 2021-09-20 NOTE — Patient Instructions (Signed)
Medication Instructions:  Your physician recommends that you continue on your current medications as directed. Please refer to the Current Medication list given to you today.   *If you need a refill on your cardiac medications before your next appointment, please call your pharmacy*   Lab Work: Your physician recommends that you have a BMET done today in the office.    If you have labs (blood work) drawn today and your tests are completely normal, you will receive your results only by: [place] (if you have MyChart) OR A paper copy in the mail If you have any lab test that is abnormal or we need to change your treatment, we will call you to review the results.   Testing/Procedures:   Your cardiac CT will be scheduled at one of the below locations:   [facility] [address] [place], [place] [address] [phone]   At [facility], please arrive at the [facility] and Children's Entrance (Entrance C2) of [facility] 30 minutes prior to test start time. You can use the FREE valet parking offered at entrance C (encouraged to control the heart rate for the test)  Proceed to the [facility] Radiology Department (first floor) to check-in and test prep.  All radiology patients and guests should use entrance C2 at [facility], accessed from [facility], even though the hospital's physical address listed is [address].      Please follow these instructions carefully (unless otherwise directed):   On the Night Before the Test: Be sure to Drink plenty of water. Do not consume any caffeinated/decaffeinated beverages or chocolate 12 hours prior to your test. Do not take any antihistamines 12 hours prior to your test.  On the Day of the Test: Drink plenty of water until 1 hour prior to the test. Do not eat any food 4 hours prior to the test. You may take your regular medications prior to the test.  Take metoprolol  (Lopressor) two hours prior to test. This is a one time dose only. FEMALES- please wear underwire-free bra if available, avoid dresses & tight clothing   After the Test: Drink plenty of water. After receiving IV contrast, you may experience a mild flushed feeling. This is normal. On occasion, you may experience a mild rash up to 24 hours after the test. This is not dangerous. If this occurs, you can take Benadryl 25 mg and increase your fluid intake. If you experience trouble breathing, this can be serious. If it is severe call 911 IMMEDIATELY. If it is mild, please call our office. If you take any of these medications: Glipizide/Metformin, Avandament, Glucavance, please do not take 48 hours after completing test unless otherwise instructed.  We will call to schedule your test 2-4 weeks out understanding that some insurance companies will need an authorization prior to the service being performed.   For non-scheduling related questions, please contact the cardiac imaging nurse navigator should you have any questions/concerns: Marchia Bond, Cardiac Imaging Nurse Navigator Gordy Clement, Cardiac Imaging Nurse Navigator [facility] Direct Office Dial: [phone]   For scheduling needs, including cancellations and rescheduling, please call [place], [phone].    Follow-Up: At [facility], you and your health needs are our priority.  As part of our continuing mission to provide you with exceptional heart care, we have created designated Provider Care Teams.  These Care Teams include your primary Cardiologist (physician) and Advanced Practice Providers (APPs -  Physician Assistants and Nurse Practitioners) who all work together to provide you with the care you need, when you need it.  We recommend signing up for the patient portal called "MyChart".  Sign up information is provided on this After Visit Summary.  MyChart is used to connect with patients for Virtual  Visits (Telemedicine).  Patients are able to view lab/test results, encounter notes, upcoming appointments, etc.  Non-urgent messages can be sent to your provider as well.   To learn more about what you can do with MyChart, go to [email or web address].    Your next appointment:   3 month(s)  The format for your next appointment:   In Person  Provider:   Jyl Heinz, MD   Other Instructions Cardiac CT Angiogram A cardiac CT angiogram is a procedure to look at the heart and the area around the heart. It may be done to help find the cause of chest pains or other symptoms of heart disease. During this procedure, a substance called contrast dye is injected into the blood vessels in the area to be checked. A large X-ray machine, called a CT scanner, then takes detailed pictures of the heart and the surrounding area. The procedure is also sometimes called a coronary CT angiogram, coronary artery scanning, or CTA. A cardiac CT angiogram allows the health care provider to see how well blood is flowing to and from the heart. The health care provider will be able to see if there are any problems, such as: Blockage or narrowing of the coronary arteries in the heart. Fluid around the heart. Signs of weakness or disease in the muscles, valves, and tissues of the heart. Tell a health care provider about: Any allergies you have. This is especially important if you have had a previous allergic reaction to contrast dye. All medicines you are taking, including vitamins, herbs, eye drops, creams, and over-the-counter medicines. Any blood disorders you have. Any surgeries you have had. Any medical conditions you have. Whether you are pregnant or may be pregnant. Any anxiety disorders, chronic pain, or other conditions you have that may increase your stress or prevent you from lying still. What are the risks? Generally, this is a safe procedure. However, problems may occur,  including: Bleeding. Infection. Allergic reactions to medicines or dyes. Damage to other structures or organs. Kidney damage from the contrast dye that is used. Increased risk of cancer from radiation exposure. This risk is low. Talk with your health care provider about: The risks and benefits of testing. How you can receive the lowest dose of radiation. What happens before the procedure? Wear comfortable clothing and remove any jewelry, glasses, dentures, and hearing aids. Follow instructions from your health care provider about eating and drinking. This may include: For 12 hours before the procedure -- avoid caffeine. This includes tea, coffee, soda, energy drinks, and diet pills. Drink plenty of water or other fluids that do not have caffeine in them. Being well hydrated can prevent complications. For 4-6 hours before the procedure -- stop eating and drinking. The contrast dye can cause nausea, but this is less likely if your stomach is empty. Ask your health care provider about changing or stopping your regular medicines. This is especially important if you are taking diabetes medicines, blood thinners, or medicines to treat problems with erections (erectile dysfunction). What happens during the procedure?  Hair on your chest may need to be removed so that small sticky patches called electrodes can be placed on your chest. These will  transmit information that helps to monitor your heart during the procedure. An IV will be inserted into one of your veins. You might be given a medicine to control your heart rate during the procedure. This will help to ensure that good images are obtained. You will be asked to lie on an exam table. This table will slide in and out of the CT machine during the procedure. Contrast dye will be injected into the IV. You might feel warm, or you may get a metallic taste in your mouth. You will be given a medicine called nitroglycerin. This will relax or dilate the  arteries in your heart. The table that you are lying on will move into the CT machine tunnel for the scan. The person running the machine will give you instructions while the scans are being done. You may be asked to: Keep your arms above your head. Hold your breath. Stay very still, even if the table is moving. When the scanning is complete, you will be moved out of the machine. The IV will be removed. The procedure may vary among health care providers and hospitals. What can I expect after the procedure? After your procedure, it is common to have: A metallic taste in your mouth from the contrast dye. A feeling of warmth. A headache from the nitroglycerin. Follow these instructions at home: Take over-the-counter and prescription medicines only as told by your health care provider. If you are told, drink enough fluid to keep your urine pale yellow. This will help to flush the contrast dye out of your body. Most people can return to their normal activities right after the procedure. Ask your health care provider what activities are safe for you. It is up to you to get the results of your procedure. Ask your health care provider, or the department that is doing the procedure, when your results will be ready. Keep all follow-up visits as told by your health care provider. This is important. Contact a health care provider if: You have any symptoms of allergy to the contrast dye. These include: Shortness of breath. Rash or hives. A racing heartbeat. Summary A cardiac CT angiogram is a procedure to look at the heart and the area around the heart. It may be done to help find the cause of chest pains or other symptoms of heart disease. During this procedure, a large X-ray machine, called a CT scanner, takes detailed pictures of the heart and the surrounding area after a contrast dye has been injected into blood vessels in the area. Ask your health care provider about changing or stopping your  regular medicines before the procedure. This is especially important if you are taking diabetes medicines, blood thinners, or medicines to treat erectile dysfunction. If you are told, drink enough fluid to keep your urine pale yellow. This will help to flush the contrast dye out of your body. This information is not intended to replace advice given to you by your health care provider. Make sure you discuss any questions you have with your health care provider. Document Revised: 09/30/2018 Document Reviewed: 09/30/2018 Elsevier Patient Education  [place].

## 2021-09-20 NOTE — Addendum Note (Signed)
Addended by: Truddie Hidden on: 09/20/2021 04:00 PM   Modules accepted: Orders
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
## 2021-09-20 NOTE — Addendum Note (Signed)
Addended by: Truddie Hidden on: 09/20/2021 04:05 PM   Modules accepted: Orders

## 2021-09-21 LAB — BASIC METABOLIC PANEL
BUN/Creatinine Ratio: 22 (ref 9–23)
BUN: 18 mg/dL (ref 6–24)
CO2: 23 mmol/L (ref 20–29)
Calcium: 10 mg/dL (ref 8.7–10.2)
Chloride: 103 mmol/L (ref 96–106)
Creatinine, Ser: 0.82 mg/dL (ref 0.57–1.00)
Glucose: 96 mg/dL (ref 70–99)
Potassium: 5.2 mmol/L (ref 3.5–5.2)
Sodium: 143 mmol/L (ref 134–144)
eGFR: 86 mL/min/{1.73_m2} (ref >59)

## 2021-09-28 DIAGNOSIS — F411 Generalized anxiety disorder: Secondary | ICD-10-CM | POA: Diagnosis not present

## 2021-09-28 DIAGNOSIS — F410 Panic disorder [episodic paroxysmal anxiety] without agoraphobia: Secondary | ICD-10-CM | POA: Diagnosis not present

## 2021-10-09 DIAGNOSIS — R1031 Right lower quadrant pain: Secondary | ICD-10-CM | POA: Diagnosis not present

## 2021-10-11 ENCOUNTER — Telehealth (HOSPITAL_COMMUNITY): Payer: Self-pay | Admitting: *Deleted

## 2021-10-11 NOTE — Telephone Encounter (Signed)
Attempted to call patient regarding upcoming cardiac CT appointment. °Left message on voicemail with name and callback number ° °Shaylea Ucci RN Navigator Cardiac Imaging °[facility] °[phone] Office °[phone] Cell ° °

## 2021-10-12 ENCOUNTER — Ambulatory Visit (HOSPITAL_COMMUNITY)
Admission: RE | Admit: 2021-10-12 | Discharge: 2021-10-12 | Disposition: A | Discharge status: Home / Self Care | Payer: BC Managed Care – PPO | Source: Ambulatory Visit | Attending: Cardiology | Admitting: Cardiology

## 2021-10-12 DIAGNOSIS — I209 Angina pectoris, unspecified: Secondary | ICD-10-CM

## 2021-10-12 DIAGNOSIS — I259 Chronic ischemic heart disease, unspecified: Secondary | ICD-10-CM | POA: Diagnosis not present

## 2021-10-12 MED ORDER — IOHEXOL 350 MG/ML SOLN
100.0000 mL | Freq: Once | INTRAVENOUS | Status: AC | PRN
  Administered 2021-10-12: 100 mL via INTRAVENOUS

## 2021-10-12 MED ORDER — NITROGLYCERIN 0.4 MG SL SUBL
SUBLINGUAL_TABLET | SUBLINGUAL | Status: AC
  Filled 2021-10-12: qty 2

## 2021-10-12 MED ORDER — NITROGLYCERIN 0.4 MG SL SUBL
0.8000 mg | SUBLINGUAL_TABLET | Freq: Once | SUBLINGUAL | Status: AC
  Administered 2021-10-12: 0.8 mg via SUBLINGUAL

## 2021-10-18 ENCOUNTER — Telehealth: Payer: Self-pay | Admitting: Cardiology

## 2021-10-18 NOTE — Telephone Encounter (Signed)
Left VM to call back 

## 2021-10-18 NOTE — Telephone Encounter (Signed)
Results reviewed with pt as per Dr. Revankar's note.  Pt verbalized understanding and had no additional questions. Routed to PCP.  

## 2021-10-18 NOTE — Telephone Encounter (Signed)
Pt would like a callback from nurse regarding CT results. Please advise

## 2021-10-30 DIAGNOSIS — F411 Generalized anxiety disorder: Secondary | ICD-10-CM | POA: Diagnosis not present

## 2021-10-30 DIAGNOSIS — F410 Panic disorder [episodic paroxysmal anxiety] without agoraphobia: Secondary | ICD-10-CM | POA: Diagnosis not present

## 2021-11-12 DIAGNOSIS — F411 Generalized anxiety disorder: Secondary | ICD-10-CM | POA: Diagnosis not present

## 2021-11-12 DIAGNOSIS — F410 Panic disorder [episodic paroxysmal anxiety] without agoraphobia: Secondary | ICD-10-CM | POA: Diagnosis not present

## 2021-11-22 ENCOUNTER — Other Ambulatory Visit: Payer: Self-pay

## 2021-11-26 ENCOUNTER — Encounter: Payer: Self-pay | Admitting: Cardiology

## 2021-11-26 ENCOUNTER — Ambulatory Visit: Payer: BC Managed Care – PPO | Attending: Cardiology | Admitting: Cardiology

## 2021-11-26 VITALS — BP 106/72 | HR 82 | Ht 62.0 in | Wt 151.6 lb

## 2021-11-26 DIAGNOSIS — E119 Type 2 diabetes mellitus without complications: Secondary | ICD-10-CM

## 2021-11-26 DIAGNOSIS — I10 Essential (primary) hypertension: Secondary | ICD-10-CM

## 2021-11-26 DIAGNOSIS — Q211 Atrial septal defect, unspecified: Secondary | ICD-10-CM | POA: Insufficient documentation

## 2021-11-26 DIAGNOSIS — E782 Mixed hyperlipidemia: Secondary | ICD-10-CM | POA: Diagnosis not present

## 2021-11-26 HISTORY — DX: Atrial septal defect, unspecified: Q21.10

## 2021-11-26 NOTE — Patient Instructions (Signed)
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
## 2021-11-29 DIAGNOSIS — F411 Generalized anxiety disorder: Secondary | ICD-10-CM | POA: Diagnosis not present

## 2021-11-29 DIAGNOSIS — F410 Panic disorder [episodic paroxysmal anxiety] without agoraphobia: Secondary | ICD-10-CM | POA: Diagnosis not present

## 2021-11-30 DIAGNOSIS — F410 Panic disorder [episodic paroxysmal anxiety] without agoraphobia: Secondary | ICD-10-CM | POA: Diagnosis not present

## 2021-11-30 DIAGNOSIS — F411 Generalized anxiety disorder: Secondary | ICD-10-CM | POA: Diagnosis not present

## 2021-12-04 DIAGNOSIS — E119 Type 2 diabetes mellitus without complications: Secondary | ICD-10-CM | POA: Diagnosis not present

## 2021-12-04 DIAGNOSIS — E559 Vitamin D deficiency, unspecified: Secondary | ICD-10-CM | POA: Diagnosis not present

## 2021-12-04 DIAGNOSIS — I10 Essential (primary) hypertension: Secondary | ICD-10-CM | POA: Diagnosis not present

## 2021-12-04 DIAGNOSIS — E038 Other specified hypothyroidism: Secondary | ICD-10-CM | POA: Diagnosis not present

## 2021-12-04 DIAGNOSIS — J018 Other acute sinusitis: Secondary | ICD-10-CM | POA: Diagnosis not present

## 2021-12-04 DIAGNOSIS — D518 Other vitamin B12 deficiency anemias: Secondary | ICD-10-CM | POA: Diagnosis not present

## 2021-12-25 DIAGNOSIS — F410 Panic disorder [episodic paroxysmal anxiety] without agoraphobia: Secondary | ICD-10-CM | POA: Diagnosis not present

## 2021-12-25 DIAGNOSIS — F411 Generalized anxiety disorder: Secondary | ICD-10-CM | POA: Diagnosis not present

## 2022-01-01 ENCOUNTER — Ambulatory Visit: Payer: BC Managed Care – PPO | Admitting: Cardiology

## 2022-01-28 DIAGNOSIS — F411 Generalized anxiety disorder: Secondary | ICD-10-CM | POA: Diagnosis not present

## 2022-01-28 DIAGNOSIS — F410 Panic disorder [episodic paroxysmal anxiety] without agoraphobia: Secondary | ICD-10-CM | POA: Diagnosis not present

## 2022-02-07 DIAGNOSIS — M545 Low back pain, unspecified: Secondary | ICD-10-CM | POA: Diagnosis not present

## 2022-02-07 DIAGNOSIS — H6241 Otitis externa in other diseases classified elsewhere, right ear: Secondary | ICD-10-CM | POA: Diagnosis not present

## 2022-10-28 ENCOUNTER — Ambulatory Visit: Payer: BC Managed Care – PPO | Admitting: Podiatry

## 2023-09-05 ENCOUNTER — Encounter: Payer: Self-pay | Admitting: Advanced Practice Midwife

## 2024-03-24 ENCOUNTER — Encounter: Payer: Self-pay | Admitting: Cardiology

## 2024-03-24 ENCOUNTER — Ambulatory Visit: Admitting: Cardiology

## 2024-03-24 VITALS — BP 138/90 | HR 84 | Ht 62.0 in | Wt 151.0 lb

## 2024-03-24 DIAGNOSIS — Q211 Atrial septal defect, unspecified: Secondary | ICD-10-CM

## 2024-03-24 DIAGNOSIS — E782 Mixed hyperlipidemia: Secondary | ICD-10-CM

## 2024-03-24 DIAGNOSIS — I10 Essential (primary) hypertension: Secondary | ICD-10-CM

## 2024-03-24 DIAGNOSIS — E119 Type 2 diabetes mellitus without complications: Secondary | ICD-10-CM | POA: Diagnosis not present

## 2024-03-24 NOTE — Patient Instructions (Signed)
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
## 2024-03-25 ENCOUNTER — Ambulatory Visit: Admitting: Cardiology
